# Patient Record
Sex: Male | Born: 1985 | Race: Black or African American | Hispanic: No | Marital: Single | Smoking: Never smoker
Health system: Southern US, Community
[De-identification: ages and names within clinical notes are randomized; demographics above are authoritative.]

## PROBLEM LIST (undated history)
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
## 2019-10-12 ENCOUNTER — Inpatient Hospital Stay (HOSPITAL_COMMUNITY)
Admission: EM | Admit: 2019-10-12 | Discharge: 2019-10-18 | DRG: 441 | Disposition: A | Discharge status: Home / Self Care | Payer: Medicare HMO | Attending: Physician Assistant | Admitting: Physician Assistant

## 2019-10-12 ENCOUNTER — Encounter (HOSPITAL_COMMUNITY): Payer: Self-pay | Admitting: *Deleted

## 2019-10-12 ENCOUNTER — Emergency Department (HOSPITAL_COMMUNITY): Payer: Medicare HMO

## 2019-10-12 DIAGNOSIS — N390 Urinary tract infection, site not specified: Secondary | ICD-10-CM | POA: Diagnosis not present

## 2019-10-12 DIAGNOSIS — S50811A Abrasion of right forearm, initial encounter: Secondary | ICD-10-CM | POA: Diagnosis present

## 2019-10-12 DIAGNOSIS — B957 Other staphylococcus as the cause of diseases classified elsewhere: Secondary | ICD-10-CM | POA: Diagnosis not present

## 2019-10-12 DIAGNOSIS — J1282 Pneumonia due to coronavirus disease 2019: Secondary | ICD-10-CM | POA: Diagnosis not present

## 2019-10-12 DIAGNOSIS — S0121XA Laceration without foreign body of nose, initial encounter: Secondary | ICD-10-CM | POA: Diagnosis present

## 2019-10-12 DIAGNOSIS — I2699 Other pulmonary embolism without acute cor pulmonale: Secondary | ICD-10-CM | POA: Diagnosis not present

## 2019-10-12 DIAGNOSIS — Z79899 Other long term (current) drug therapy: Secondary | ICD-10-CM | POA: Diagnosis not present

## 2019-10-12 DIAGNOSIS — S2241XA Multiple fractures of ribs, right side, initial encounter for closed fracture: Secondary | ICD-10-CM | POA: Diagnosis present

## 2019-10-12 DIAGNOSIS — U071 COVID-19: Secondary | ICD-10-CM | POA: Diagnosis present

## 2019-10-12 DIAGNOSIS — Z23 Encounter for immunization: Secondary | ICD-10-CM

## 2019-10-12 DIAGNOSIS — S2249XS Multiple fractures of ribs, unspecified side, sequela: Secondary | ICD-10-CM | POA: Diagnosis not present

## 2019-10-12 DIAGNOSIS — W51XXXA Accidental striking against or bumped into by another person, initial encounter: Secondary | ICD-10-CM | POA: Diagnosis not present

## 2019-10-12 DIAGNOSIS — S36116A Major laceration of liver, initial encounter: Secondary | ICD-10-CM | POA: Diagnosis present

## 2019-10-12 DIAGNOSIS — R6510 Systemic inflammatory response syndrome (SIRS) of non-infectious origin without acute organ dysfunction: Secondary | ICD-10-CM | POA: Diagnosis not present

## 2019-10-12 DIAGNOSIS — I5031 Acute diastolic (congestive) heart failure: Secondary | ICD-10-CM | POA: Diagnosis not present

## 2019-10-12 DIAGNOSIS — S31109A Unspecified open wound of abdominal wall, unspecified quadrant without penetration into peritoneal cavity, initial encounter: Secondary | ICD-10-CM | POA: Diagnosis present

## 2019-10-12 DIAGNOSIS — T1490XA Injury, unspecified, initial encounter: Secondary | ICD-10-CM | POA: Diagnosis present

## 2019-10-12 DIAGNOSIS — R0602 Shortness of breath: Secondary | ICD-10-CM

## 2019-10-12 DIAGNOSIS — S2249XA Multiple fractures of ribs, unspecified side, initial encounter for closed fracture: Secondary | ICD-10-CM | POA: Diagnosis present

## 2019-10-12 DIAGNOSIS — Z419 Encounter for procedure for purposes other than remedying health state, unspecified: Secondary | ICD-10-CM

## 2019-10-12 DIAGNOSIS — S36113A Laceration of liver, unspecified degree, initial encounter: Secondary | ICD-10-CM

## 2019-10-12 LAB — COMPREHENSIVE METABOLIC PANEL
ALT: 496 U/L — ABNORMAL HIGH (ref 0–44)
AST: 483 U/L — ABNORMAL HIGH (ref 15–41)
Albumin: 3.3 g/dL — ABNORMAL LOW (ref 3.5–5.0)
Alkaline Phosphatase: 54 U/L (ref 38–126)
Anion gap: 10 (ref 5–15)
BUN: 9 mg/dL (ref 6–20)
CO2: 22 mmol/L (ref 22–32)
Calcium: 8.3 mg/dL — ABNORMAL LOW (ref 8.9–10.3)
Chloride: 103 mmol/L (ref 98–111)
Creatinine, Ser: 0.97 mg/dL (ref 0.61–1.24)
GFR, Estimated: 47 mL/min — ABNORMAL LOW (ref >60)
Glucose, Bld: 146 mg/dL — ABNORMAL HIGH (ref 70–99)
Potassium: 3.9 mmol/L (ref 3.5–5.1)
Sodium: 135 mmol/L (ref 135–145)
Total Bilirubin: 0.6 mg/dL (ref 0.3–1.2)
Total Protein: 6.3 g/dL — ABNORMAL LOW (ref 6.5–8.1)

## 2019-10-12 LAB — I-STAT CHEM 8, ED
BUN: 9 mg/dL (ref 6–20)
Calcium, Ion: 1.02 mmol/L — ABNORMAL LOW (ref 1.15–1.40)
Chloride: 102 mmol/L (ref 98–111)
Creatinine, Ser: 0.9 mg/dL (ref 0.61–1.24)
Glucose, Bld: 143 mg/dL — ABNORMAL HIGH (ref 70–99)
HCT: 35 % — ABNORMAL LOW (ref 39.0–52.0)
Hemoglobin: 11.9 g/dL — ABNORMAL LOW (ref 13.0–17.0)
Potassium: 3.8 mmol/L (ref 3.5–5.1)
Sodium: 136 mmol/L (ref 135–145)
TCO2: 22 mmol/L (ref 22–32)

## 2019-10-12 LAB — LACTIC ACID, PLASMA: Lactic Acid, Venous: 2.8 mmol/L (ref 0.5–1.9)

## 2019-10-12 LAB — CBC
HCT: 36.7 % — ABNORMAL LOW (ref 39.0–52.0)
Hemoglobin: 12.3 g/dL — ABNORMAL LOW (ref 13.0–17.0)
MCH: 27.6 pg (ref 26.0–34.0)
MCHC: 33.5 g/dL (ref 30.0–36.0)
MCV: 82.3 fL (ref 80.0–100.0)
Platelets: 184 10*3/uL (ref 150–400)
RBC: 4.46 MIL/uL (ref 4.22–5.81)
RDW: 12.4 % (ref 11.5–15.5)
WBC: 8.4 10*3/uL (ref 4.0–10.5)
nRBC: 0 % (ref 0.0–0.2)

## 2019-10-12 LAB — RESPIRATORY PANEL BY RT PCR (FLU A&B, COVID)
Influenza A by PCR: NEGATIVE
Influenza B by PCR: NEGATIVE
SARS Coronavirus 2 by RT PCR: POSITIVE — AB

## 2019-10-12 LAB — SAMPLE TO BLOOD BANK

## 2019-10-12 LAB — PROTIME-INR
INR: 1.1 (ref 0.8–1.2)
Prothrombin Time: 14.1 seconds (ref 11.4–15.2)

## 2019-10-12 LAB — ETHANOL: Alcohol, Ethyl (B): <10 mg/dL (ref <10)

## 2019-10-12 MED ORDER — TETANUS-DIPHTH-ACELL PERTUSSIS 5-2.5-18.5 LF-MCG/0.5 IM SUSP
0.5000 mL | Freq: Once | INTRAMUSCULAR | Status: AC
  Administered 2019-10-12: 0.5 mL via INTRAMUSCULAR
  Filled 2019-10-12: qty 0.5

## 2019-10-12 MED ORDER — FENTANYL CITRATE (PF) 100 MCG/2ML IJ SOLN: 100.0000 ug | Freq: Once | INTRAMUSCULAR | Status: AC

## 2019-10-12 MED ORDER — IOHEXOL 300 MG/ML  SOLN
100.0000 mL | Freq: Once | INTRAMUSCULAR | Status: AC | PRN
  Administered 2019-10-12: 100 mL via INTRAVENOUS

## 2019-10-12 MED ORDER — FENTANYL CITRATE (PF) 100 MCG/2ML IJ SOLN
INTRAMUSCULAR | Status: AC
  Administered 2019-10-12: 100 ug via INTRAVENOUS
  Filled 2019-10-12: qty 2

## 2019-10-12 MED ORDER — MORPHINE SULFATE (PF) 4 MG/ML IV SOLN: 4.0000 mg | Freq: Once | INTRAVENOUS | Status: DC

## 2019-10-12 NOTE — ED Notes (Signed)
Pt says he speaks Somali, trying to reach interpreter for when pt returns from CT. No identification found on pt. [employer] standing by.

## 2019-10-12 NOTE — ED Notes (Signed)
[employer] asked pt for family contact information, pt gave two phone numbers, both numbers not working numbers.
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
## 2019-10-12 NOTE — ED Notes (Signed)
Patient transported to CT 

## 2019-10-12 NOTE — ED Notes (Signed)
Spoke with pt via tele interpreter. He keeps saying he has pain "in the bones around my heart" and that his "head hurts". He reports his name is David Gordon bd 06-11-1985. I offered to call his family, he declined. I placed his cell phone next to him. He is asking about his headphones and keys. I told him his keys were placed in his belonging bag, I could not find any headphones on his person. Registration and [employer] updated on pt identification.

## 2019-10-12 NOTE — ED Notes (Signed)
Dr Janee Morn at bedside. Resident and Trauma MD made aware of COVID Positive results
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
## 2019-10-13 LAB — URINALYSIS, ROUTINE W REFLEX MICROSCOPIC
Bacteria, UA: NONE SEEN
Bilirubin Urine: NEGATIVE
Glucose, UA: NEGATIVE mg/dL
Ketones, ur: NEGATIVE mg/dL
Leukocytes,Ua: NEGATIVE
Nitrite: NEGATIVE
Protein, ur: NEGATIVE mg/dL
Specific Gravity, Urine: >1.046 — ABNORMAL HIGH (ref 1.005–1.030)
pH: 5 (ref 5.0–8.0)

## 2019-10-13 LAB — CBC
HCT: 34.7 % — ABNORMAL LOW (ref 39.0–52.0)
HCT: 34.6 % — ABNORMAL LOW (ref 39.0–52.0)
HCT: 38.6 % — ABNORMAL LOW (ref 39.0–52.0)
Hemoglobin: 11.9 g/dL — ABNORMAL LOW (ref 13.0–17.0)
Hemoglobin: 11.6 g/dL — ABNORMAL LOW (ref 13.0–17.0)
Hemoglobin: 12.8 g/dL — ABNORMAL LOW (ref 13.0–17.0)
MCH: 29 pg (ref 26.0–34.0)
MCH: 28.5 pg (ref 26.0–34.0)
MCH: 28.3 pg (ref 26.0–34.0)
MCHC: 34.3 g/dL (ref 30.0–36.0)
MCHC: 33.5 g/dL (ref 30.0–36.0)
MCHC: 33.2 g/dL (ref 30.0–36.0)
MCV: 84.6 fL (ref 80.0–100.0)
MCV: 85 fL (ref 80.0–100.0)
MCV: 85.2 fL (ref 80.0–100.0)
Platelets: UNDETERMINED K/uL (ref 150–400)
Platelets: 143 10*3/uL — ABNORMAL LOW (ref 150–400)
Platelets: 165 K/uL (ref 150–400)
RBC: 4.1 MIL/uL — ABNORMAL LOW (ref 4.22–5.81)
RBC: 4.07 MIL/uL — ABNORMAL LOW (ref 4.22–5.81)
RBC: 4.53 MIL/uL (ref 4.22–5.81)
RDW: 12.9 % (ref 11.5–15.5)
RDW: 12.8 % (ref 11.5–15.5)
RDW: 13.1 % (ref 11.5–15.5)
WBC: 8.2 K/uL (ref 4.0–10.5)
WBC: 8.2 10*3/uL (ref 4.0–10.5)
WBC: 8.4 K/uL (ref 4.0–10.5)
nRBC: 0 % (ref 0.0–0.2)
nRBC: 0 % (ref 0.0–0.2)
nRBC: 0 % (ref 0.0–0.2)

## 2019-10-13 LAB — RAPID URINE DRUG SCREEN, HOSP PERFORMED
Amphetamines: NOT DETECTED
Barbiturates: NOT DETECTED
Benzodiazepines: NOT DETECTED
Cocaine: NOT DETECTED
Opiates: NOT DETECTED
Tetrahydrocannabinol: NOT DETECTED

## 2019-10-13 LAB — BASIC METABOLIC PANEL WITH GFR
Anion gap: 12 (ref 5–15)
BUN: 7 mg/dL (ref 6–20)
CO2: 16 mmol/L — ABNORMAL LOW (ref 22–32)
Calcium: 8.3 mg/dL — ABNORMAL LOW (ref 8.9–10.3)
Chloride: 107 mmol/L (ref 98–111)
Creatinine, Ser: 0.76 mg/dL (ref 0.61–1.24)
GFR, Estimated: >60 mL/min
Glucose, Bld: 130 mg/dL — ABNORMAL HIGH (ref 70–99)
Potassium: 4.4 mmol/L (ref 3.5–5.1)
Sodium: 135 mmol/L (ref 135–145)

## 2019-10-13 LAB — MRSA PCR SCREENING: MRSA by PCR: NEGATIVE

## 2019-10-13 MED ORDER — ACETAMINOPHEN 325 MG PO TABS
650.0000 mg | ORAL_TABLET | ORAL | Status: DC | PRN
  Administered 2019-10-13 – 2019-10-14 (×2): 650 mg via ORAL
  Filled 2019-10-13 (×2): qty 2

## 2019-10-13 MED ORDER — METHOCARBAMOL 1000 MG/10ML IJ SOLN
1000.0000 mg | Freq: Three times a day (TID) | INTRAVENOUS | Status: DC | PRN
  Filled 2019-10-13: qty 10

## 2019-10-13 MED ORDER — HYDROMORPHONE HCL 1 MG/ML IJ SOLN
1.0000 mg | INTRAMUSCULAR | Status: DC | PRN
  Administered 2019-10-14: 1 mg via INTRAVENOUS
  Filled 2019-10-13: qty 1

## 2019-10-13 MED ORDER — POTASSIUM CHLORIDE IN NACL 20-0.9 MEQ/L-% IV SOLN
INTRAVENOUS | Status: DC
  Administered 2019-10-14: 1000 mL via INTRAVENOUS
  Filled 2019-10-13 (×6): qty 1000

## 2019-10-13 MED ORDER — OXYCODONE HCL 5 MG PO TABS: 5.0000 mg | ORAL_TABLET | ORAL | Status: DC | PRN

## 2019-10-13 MED ORDER — OXYCODONE HCL 5 MG PO TABS: 10.0000 mg | ORAL_TABLET | ORAL | Status: DC | PRN

## 2019-10-13 NOTE — Progress Notes (Signed)
Pt was admitted to the floor and assessed with David Gordon, Char[employer] fundraiser. Significant language barrier persisted despite use of interpreter system. Pt did know where he lived and his name and DOB and why he was here. HR was 120s-130s upon arrival but has since stabilized in the 90s-100s.  UDS was ordered for concern of possible inebriation per conversation from the interpreter stating he seemed to be altered when speaking in his native language.Collected via in and out cath d/t pt retaining 564 in his bladder via bladder scan. Pt has yet to void independently.  Pt was able to move all extremities upon request and spontaneously. No contact information for the family at this time. Will attempt later on when better communication can be facilitated. Pt has no complaints of pain at this time Will continue to monitor and pass on to Day RN.

## 2019-10-13 NOTE — Progress Notes (Signed)
Received call from patient's sister, Yasmin, who stated that her brother has mental illness and has not been taking his medications properly. She emailed this RN a list of his medications. This information was forwarded to pharmacy tech, Nicolette Bang, who stated she would enter these medications into his chart. Also paged Trauma MD on call.
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
## 2019-10-14 ENCOUNTER — Inpatient Hospital Stay (HOSPITAL_COMMUNITY): Payer: Medicare HMO

## 2019-10-14 ENCOUNTER — Encounter (HOSPITAL_COMMUNITY): Payer: Self-pay | Admitting: General Surgery

## 2019-10-14 DIAGNOSIS — I2699 Other pulmonary embolism without acute cor pulmonale: Secondary | ICD-10-CM | POA: Diagnosis not present

## 2019-10-14 DIAGNOSIS — W51XXXA Accidental striking against or bumped into by another person, initial encounter: Secondary | ICD-10-CM | POA: Diagnosis present

## 2019-10-14 DIAGNOSIS — S2249XA Multiple fractures of ribs, unspecified side, initial encounter for closed fracture: Secondary | ICD-10-CM | POA: Diagnosis present

## 2019-10-14 DIAGNOSIS — R6510 Systemic inflammatory response syndrome (SIRS) of non-infectious origin without acute organ dysfunction: Secondary | ICD-10-CM | POA: Diagnosis not present

## 2019-10-14 DIAGNOSIS — S2241XA Multiple fractures of ribs, right side, initial encounter for closed fracture: Secondary | ICD-10-CM

## 2019-10-14 DIAGNOSIS — S2249XS Multiple fractures of ribs, unspecified side, sequela: Secondary | ICD-10-CM

## 2019-10-14 DIAGNOSIS — S36116A Major laceration of liver, initial encounter: Secondary | ICD-10-CM

## 2019-10-14 DIAGNOSIS — U071 COVID-19: Secondary | ICD-10-CM

## 2019-10-14 LAB — C-REACTIVE PROTEIN: CRP: 7 mg/dL — ABNORMAL HIGH (ref <1.0)

## 2019-10-14 LAB — CBC
HCT: 35 % — ABNORMAL LOW (ref 39.0–52.0)
HCT: 34.3 % — ABNORMAL LOW (ref 39.0–52.0)
Hemoglobin: 11.8 g/dL — ABNORMAL LOW (ref 13.0–17.0)
Hemoglobin: 11.8 g/dL — ABNORMAL LOW (ref 13.0–17.0)
MCH: 28.6 pg (ref 26.0–34.0)
MCH: 28.8 pg (ref 26.0–34.0)
MCHC: 33.7 g/dL (ref 30.0–36.0)
MCHC: 34.4 g/dL (ref 30.0–36.0)
MCV: 85 fL (ref 80.0–100.0)
MCV: 83.7 fL (ref 80.0–100.0)
Platelets: 175 10*3/uL (ref 150–400)
Platelets: 147 10*3/uL — ABNORMAL LOW (ref 150–400)
RBC: 4.12 MIL/uL — ABNORMAL LOW (ref 4.22–5.81)
RBC: 4.1 MIL/uL — ABNORMAL LOW (ref 4.22–5.81)
RDW: 13.2 % (ref 11.5–15.5)
RDW: 12.9 % (ref 11.5–15.5)
WBC: 10.6 10*3/uL — ABNORMAL HIGH (ref 4.0–10.5)
WBC: 10.5 10*3/uL (ref 4.0–10.5)
nRBC: 0 % (ref 0.0–0.2)
nRBC: 0 % (ref 0.0–0.2)

## 2019-10-14 LAB — HIV ANTIBODY (ROUTINE TESTING W REFLEX): HIV Screen 4th Generation wRfx: NONREACTIVE

## 2019-10-14 LAB — BASIC METABOLIC PANEL
Anion gap: 9 (ref 5–15)
BUN: 5 mg/dL — ABNORMAL LOW (ref 6–20)
CO2: 24 mmol/L (ref 22–32)
Calcium: 8.7 mg/dL — ABNORMAL LOW (ref 8.9–10.3)
Chloride: 105 mmol/L (ref 98–111)
Creatinine, Ser: 0.71 mg/dL (ref 0.61–1.24)
GFR, Estimated: >60 mL/min (ref >60)
Glucose, Bld: 101 mg/dL — ABNORMAL HIGH (ref 70–99)
Potassium: 4.3 mmol/L (ref 3.5–5.1)
Sodium: 138 mmol/L (ref 135–145)

## 2019-10-14 LAB — LACTATE DEHYDROGENASE: LDH: 345 U/L — ABNORMAL HIGH (ref 98–192)

## 2019-10-14 LAB — PROTIME-INR
INR: 1.3 — ABNORMAL HIGH (ref 0.8–1.2)
Prothrombin Time: 15.5 seconds — ABNORMAL HIGH (ref 11.4–15.2)

## 2019-10-14 LAB — HEPATIC FUNCTION PANEL
ALT: 563 U/L — ABNORMAL HIGH (ref 0–44)
AST: 255 U/L — ABNORMAL HIGH (ref 15–41)
Albumin: 3.1 g/dL — ABNORMAL LOW (ref 3.5–5.0)
Alkaline Phosphatase: 51 U/L (ref 38–126)
Bilirubin, Direct: 0.3 mg/dL — ABNORMAL HIGH (ref 0.0–0.2)
Indirect Bilirubin: 0.8 mg/dL (ref 0.3–0.9)
Total Bilirubin: 1.1 mg/dL (ref 0.3–1.2)
Total Protein: 6.1 g/dL — ABNORMAL LOW (ref 6.5–8.1)

## 2019-10-14 LAB — LACTIC ACID, PLASMA: Lactic Acid, Venous: 1.4 mmol/L (ref 0.5–1.9)

## 2019-10-14 LAB — APTT: aPTT: 27 seconds (ref 24–36)

## 2019-10-14 LAB — FERRITIN: Ferritin: 1159 ng/mL — ABNORMAL HIGH (ref 24–336)

## 2019-10-14 LAB — PROCALCITONIN: Procalcitonin: <0.1 ng/mL

## 2019-10-14 LAB — D-DIMER, QUANTITATIVE: D-Dimer, Quant: 10.72 ug/mL-FEU — ABNORMAL HIGH (ref 0.00–0.50)

## 2019-10-14 MED ORDER — OXYCODONE HCL 5 MG PO TABS: 5.0000 mg | ORAL_TABLET | ORAL | Status: DC | PRN

## 2019-10-14 MED ORDER — METHYLPREDNISOLONE SODIUM SUCC 40 MG IJ SOLR
40.0000 mg | Freq: Two times a day (BID) | INTRAMUSCULAR | Status: DC
  Administered 2019-10-14 – 2019-10-18 (×8): 40 mg via INTRAVENOUS
  Filled 2019-10-14 (×8): qty 1

## 2019-10-14 MED ORDER — IOHEXOL 350 MG/ML SOLN
100.0000 mL | Freq: Once | INTRAVENOUS | Status: AC | PRN
  Administered 2019-10-14: 100 mL via INTRAVENOUS

## 2019-10-14 MED ORDER — SODIUM CHLORIDE 0.9 % IV SOLN
100.0000 mg | Freq: Every day | INTRAVENOUS | Status: AC
  Administered 2019-10-15 – 2019-10-18 (×4): 100 mg via INTRAVENOUS
  Filled 2019-10-14 (×4): qty 20

## 2019-10-14 MED ORDER — ONDANSETRON HCL 4 MG/2ML IJ SOLN
4.0000 mg | Freq: Four times a day (QID) | INTRAMUSCULAR | Status: DC | PRN
  Administered 2019-10-14: 4 mg via INTRAVENOUS
  Filled 2019-10-14: qty 2

## 2019-10-14 MED ORDER — METOPROLOL TARTRATE 5 MG/5ML IV SOLN
5.0000 mg | Freq: Three times a day (TID) | INTRAVENOUS | Status: DC | PRN
  Administered 2019-10-16: 5 mg via INTRAVENOUS
  Filled 2019-10-14: qty 5

## 2019-10-14 MED ORDER — SODIUM CHLORIDE 0.9 % IV SOLN
200.0000 mg | Freq: Once | INTRAVENOUS | Status: AC
  Administered 2019-10-14: 200 mg via INTRAVENOUS
  Filled 2019-10-14: qty 40

## 2019-10-14 MED ORDER — ACETAMINOPHEN 500 MG PO TABS
1000.0000 mg | ORAL_TABLET | Freq: Four times a day (QID) | ORAL | Status: DC
  Administered 2019-10-14 – 2019-10-18 (×10): 1000 mg via ORAL
  Filled 2019-10-14 (×12): qty 2

## 2019-10-14 NOTE — Progress Notes (Signed)
   10/12/19 2320  Assess: MEWS Score  Temp (!) 100.9 F (38.3 C)  ECG Heart Rate (!) 124  Resp (!) 22  Level of Consciousness Alert  SpO2 98 %  O2 Device Room Air  Assess: MEWS Score  MEWS Temp 1  MEWS Systolic 0  MEWS Pulse 2  MEWS RR 1  MEWS LOC 0  MEWS Score 4  MEWS Score Color Red  Assess: if the MEWS score is Yellow or Red  Were vital signs taken at a resting state? Yes  Focused Assessment Change from prior assessment (see assessment flowsheet)  Early Detection of Sepsis Score *See Row Information* Low  MEWS guidelines implemented *See Row Information* Yes  Treat  MEWS Interventions Escalated (See documentation below)  Pain Scale 0-10  Pain Score 0  Take Vital Signs  Increase Vital Sign Frequency  Red: Q 1hr X 4 then Q 4hr X 4, if remains red, continue Q 4hrs  Escalate  MEWS: Escalate Red: discuss with charge nurse/RN and provider, consider discussing with RRT  Notify: Charge Nurse/RN  Name of Charge Nurse/RN Notified Nikki, RN  Date Charge Nurse/RN Notified 10/12/19  Time Charge Nurse/RN Notified 2320  Document  Patient Outcome Other (Comment) (Monitoring, Red in ED)
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
## 2019-10-14 NOTE — Sepsis Progress Note (Signed)
Spoke with provider regarding antibiotics. She is waiting on more information with radiological test results before deciding on the best course of action.

## 2019-10-14 NOTE — Progress Notes (Signed)
   10/14/19 0751  Assess: MEWS Score  Temp (!) 101.9 F (38.8 C)  BP 124/80  Pulse Rate (!) 115  ECG Heart Rate (!) 114  Resp 20  SpO2 95 %  Assess: MEWS Score  MEWS Temp 2  MEWS Systolic 0  MEWS Pulse 2  MEWS RR 0  MEWS LOC 0  MEWS Score 4  MEWS Score Color Red  Assess: if the MEWS score is Yellow or Red  Were vital signs taken at a resting state? Yes  Focused Assessment No change from prior assessment  Early Detection of Sepsis Score *See Row Information* Low  MEWS guidelines implemented *See Row Information* Yes  Treat  MEWS Interventions Escalated (See documentation below)  Pain Scale 0-10  Pain Score 3  Pain Type Acute pain  Pain Location Back  Pain Orientation Right  Pain Frequency Intermittent  Pain Onset On-going  Patients Stated Pain Goal 3  Pain Intervention(s) Repositioned  Take Vital Signs  Increase Vital Sign Frequency  Red: Q 1hr X 4 then Q 4hr X 4, if remains red, continue Q 4hrs  Escalate  MEWS: Escalate Red: discuss with charge nurse/RN and provider, consider discussing with RRT  Notify: Charge Nurse/RN  Name of Charge Nurse/RN Notified Erick Alley RN  Date Charge Nurse/RN Notified 10/14/19  Time Charge Nurse/RN Notified 0805  Notify: Provider  Provider Name/Title Trauma  Date Provider Notified 10/14/19  Time Provider Notified 0805  Notification Type Page  Notification Reason Other (Comment) (Red MEWS)  Notify: Rapid Response  Name of Rapid Response RN Notified N/A  Document  Progress note created (see row info) Yes

## 2019-10-14 NOTE — Sepsis Progress Note (Signed)
Notified provider of need to order antibiotics. Waiting to heat back. Patient is Covid positive.
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
## 2019-10-14 NOTE — Progress Notes (Signed)
Secure chat sent to bedside nurse at 1318 requesting information about LA and blood culture draws. Also, asking if bedside nurse knew if the MD has plans to order antibiotics and fluids. I have not heard back from the bedside nurse.
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
## 2019-10-14 NOTE — Progress Notes (Signed)
PA on-call was notified via AMION about patient's elevated RR (high 30s) and HR (low 110s-high 120s). Temp came down at 99.5.
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
## 2019-10-15 ENCOUNTER — Encounter (HOSPITAL_COMMUNITY): Admission: EM | Disposition: A | Discharge status: Home / Self Care | Payer: Self-pay | Source: Home / Self Care

## 2019-10-15 ENCOUNTER — Inpatient Hospital Stay (HOSPITAL_COMMUNITY): Payer: Medicare HMO | Admitting: Anesthesiology

## 2019-10-15 ENCOUNTER — Inpatient Hospital Stay (HOSPITAL_COMMUNITY): Payer: Medicare HMO

## 2019-10-15 ENCOUNTER — Encounter (HOSPITAL_COMMUNITY): Payer: Medicare HMO

## 2019-10-15 HISTORY — PX: VENA CAVA FILTER PLACEMENT: SHX1085

## 2019-10-15 LAB — CBC WITH DIFFERENTIAL/PLATELET
Abs Immature Granulocytes: 0.04 10*3/uL (ref 0.00–0.07)
Basophils Absolute: 0 10*3/uL (ref 0.0–0.1)
Basophils Relative: 0 %
Eosinophils Absolute: 0 10*3/uL (ref 0.0–0.5)
Eosinophils Relative: 0 %
HCT: 33.9 % — ABNORMAL LOW (ref 39.0–52.0)
Hemoglobin: 11.6 g/dL — ABNORMAL LOW (ref 13.0–17.0)
Immature Granulocytes: 0 %
Lymphocytes Relative: 6 %
Lymphs Abs: 0.6 10*3/uL — ABNORMAL LOW (ref 0.7–4.0)
MCH: 28.4 pg (ref 26.0–34.0)
MCHC: 34.2 g/dL (ref 30.0–36.0)
MCV: 83.1 fL (ref 80.0–100.0)
Monocytes Absolute: 0.4 10*3/uL (ref 0.1–1.0)
Monocytes Relative: 4 %
Neutro Abs: 9.1 10*3/uL — ABNORMAL HIGH (ref 1.7–7.7)
Neutrophils Relative %: 90 %
Platelets: 145 10*3/uL — ABNORMAL LOW (ref 150–400)
RBC: 4.08 MIL/uL — ABNORMAL LOW (ref 4.22–5.81)
RDW: 12.6 % (ref 11.5–15.5)
WBC: 10.2 10*3/uL (ref 4.0–10.5)
nRBC: 0 % (ref 0.0–0.2)

## 2019-10-15 LAB — COMPREHENSIVE METABOLIC PANEL
ALT: 444 U/L — ABNORMAL HIGH (ref 0–44)
AST: 146 U/L — ABNORMAL HIGH (ref 15–41)
Albumin: 3.2 g/dL — ABNORMAL LOW (ref 3.5–5.0)
Alkaline Phosphatase: 52 U/L (ref 38–126)
Anion gap: 10 (ref 5–15)
BUN: 8 mg/dL (ref 6–20)
CO2: 22 mmol/L (ref 22–32)
Calcium: 9.3 mg/dL (ref 8.9–10.3)
Chloride: 105 mmol/L (ref 98–111)
Creatinine, Ser: 0.71 mg/dL (ref 0.61–1.24)
GFR, Estimated: >60 mL/min (ref >60)
Glucose, Bld: 135 mg/dL — ABNORMAL HIGH (ref 70–99)
Potassium: 4.6 mmol/L (ref 3.5–5.1)
Sodium: 137 mmol/L (ref 135–145)
Total Bilirubin: 1.2 mg/dL (ref 0.3–1.2)
Total Protein: 6.7 g/dL (ref 6.5–8.1)

## 2019-10-15 LAB — SURGICAL PCR SCREEN
MRSA, PCR: NEGATIVE
Staphylococcus aureus: NEGATIVE

## 2019-10-15 LAB — PHOSPHORUS: Phosphorus: 2.9 mg/dL (ref 2.5–4.6)

## 2019-10-15 LAB — D-DIMER, QUANTITATIVE: D-Dimer, Quant: 8.56 ug/mL-FEU — ABNORMAL HIGH (ref 0.00–0.50)

## 2019-10-15 LAB — C-REACTIVE PROTEIN: CRP: 13 mg/dL — ABNORMAL HIGH (ref <1.0)

## 2019-10-15 LAB — FERRITIN: Ferritin: 891 ng/mL — ABNORMAL HIGH (ref 24–336)

## 2019-10-15 LAB — MAGNESIUM: Magnesium: 1.8 mg/dL (ref 1.7–2.4)

## 2019-10-15 SURGERY — INSERTION, FILTER, VENA CAVA
Anesthesia: General | Site: Groin | Laterality: Right

## 2019-10-15 MED ORDER — PROPOFOL 10 MG/ML IV BOLUS
INTRAVENOUS | Status: AC
  Filled 2019-10-15: qty 20

## 2019-10-15 MED ORDER — RISPERIDONE 0.5 MG PO TABS
4.0000 mg | ORAL_TABLET | Freq: Two times a day (BID) | ORAL | Status: DC
  Administered 2019-10-15 – 2019-10-18 (×6): 4 mg via ORAL
  Filled 2019-10-15 (×6): qty 8

## 2019-10-15 MED ORDER — METOPROLOL TARTRATE 5 MG/5ML IV SOLN
5.0000 mg | Freq: Once | INTRAVENOUS | Status: AC
  Administered 2019-10-15: 5 mg via INTRAVENOUS
  Filled 2019-10-15: qty 5

## 2019-10-15 MED ORDER — SODIUM CHLORIDE 0.9 % IV SOLN
INTRAVENOUS | Status: DC | PRN
  Administered 2019-10-15: 500 mL

## 2019-10-15 MED ORDER — ONDANSETRON HCL 4 MG/2ML IJ SOLN
INTRAMUSCULAR | Status: AC
  Filled 2019-10-15: qty 2

## 2019-10-15 MED ORDER — LACTATED RINGERS IV SOLN: INTRAVENOUS | Status: DC | PRN

## 2019-10-15 MED ORDER — ACETAMINOPHEN 325 MG PO TABS: 325.0000 mg | ORAL_TABLET | Freq: Once | ORAL | Status: DC | PRN

## 2019-10-15 MED ORDER — DEXAMETHASONE SODIUM PHOSPHATE 10 MG/ML IJ SOLN
INTRAMUSCULAR | Status: DC | PRN
  Administered 2019-10-15: 10 mg via INTRAVENOUS

## 2019-10-15 MED ORDER — LACTATED RINGERS IV SOLN: INTRAVENOUS | Status: DC

## 2019-10-15 MED ORDER — POTASSIUM CHLORIDE 2 MEQ/ML IV SOLN
INTRAVENOUS | Status: AC
  Filled 2019-10-15: qty 1000

## 2019-10-15 MED ORDER — DEXAMETHASONE SODIUM PHOSPHATE 10 MG/ML IJ SOLN
INTRAMUSCULAR | Status: AC
  Filled 2019-10-15: qty 1

## 2019-10-15 MED ORDER — AMISULPRIDE (ANTIEMETIC) 5 MG/2ML IV SOLN: 10.0000 mg | Freq: Once | INTRAVENOUS | Status: DC | PRN

## 2019-10-15 MED ORDER — ONDANSETRON HCL 4 MG/2ML IJ SOLN
INTRAMUSCULAR | Status: DC | PRN
  Administered 2019-10-15: 4 mg via INTRAVENOUS

## 2019-10-15 MED ORDER — LIDOCAINE 2% (20 MG/ML) 5 ML SYRINGE
INTRAMUSCULAR | Status: DC | PRN
  Administered 2019-10-15: 40 mg via INTRAVENOUS

## 2019-10-15 MED ORDER — SODIUM CHLORIDE (PF) 0.9 % IJ SOLN
INTRAVENOUS | Status: DC | PRN
  Administered 2019-10-15: 5 mL via INTRAMUSCULAR

## 2019-10-15 MED ORDER — QUETIAPINE FUMARATE ER 300 MG PO TB24
300.0000 mg | ORAL_TABLET | Freq: Every day | ORAL | Status: DC
  Administered 2019-10-15 – 2019-10-17 (×3): 300 mg via ORAL
  Filled 2019-10-15 (×4): qty 1

## 2019-10-15 MED ORDER — FENTANYL CITRATE (PF) 250 MCG/5ML IJ SOLN
INTRAMUSCULAR | Status: AC
Start: 2019-10-15 — End: ?
  Filled 2019-10-15: qty 5

## 2019-10-15 MED ORDER — SUCCINYLCHOLINE CHLORIDE 200 MG/10ML IV SOSY
PREFILLED_SYRINGE | INTRAVENOUS | Status: AC
  Filled 2019-10-15: qty 10

## 2019-10-15 MED ORDER — HYDROMORPHONE HCL 1 MG/ML IJ SOLN
1.0000 mg | Freq: Four times a day (QID) | INTRAMUSCULAR | Status: DC | PRN
  Administered 2019-10-16: 1 mg via INTRAVENOUS
  Filled 2019-10-15: qty 1

## 2019-10-15 MED ORDER — BENZTROPINE MESYLATE 1 MG PO TABS
3.0000 mg | ORAL_TABLET | Freq: Two times a day (BID) | ORAL | Status: DC
  Administered 2019-10-15 – 2019-10-18 (×6): 3 mg via ORAL
  Filled 2019-10-15 (×6): qty 3

## 2019-10-15 MED ORDER — FENTANYL CITRATE (PF) 250 MCG/5ML IJ SOLN
INTRAMUSCULAR | Status: DC | PRN
Start: 2019-10-15 — End: 2019-10-15
  Administered 2019-10-15: 100 ug via INTRAVENOUS
  Administered 2019-10-15 (×3): 50 ug via INTRAVENOUS

## 2019-10-15 MED ORDER — FENTANYL CITRATE (PF) 100 MCG/2ML IJ SOLN: 25.0000 ug | INTRAMUSCULAR | Status: DC | PRN

## 2019-10-15 MED ORDER — SUCCINYLCHOLINE CHLORIDE 200 MG/10ML IV SOSY
PREFILLED_SYRINGE | INTRAVENOUS | Status: DC | PRN
  Administered 2019-10-15: 140 mg via INTRAVENOUS

## 2019-10-15 MED ORDER — POTASSIUM CHLORIDE IN NACL 20-0.9 MEQ/L-% IV SOLN
INTRAVENOUS | Status: DC
  Filled 2019-10-15: qty 1000

## 2019-10-15 MED ORDER — MIDAZOLAM HCL 2 MG/2ML IJ SOLN
INTRAMUSCULAR | Status: DC | PRN
  Administered 2019-10-15: 2 mg via INTRAVENOUS

## 2019-10-15 MED ORDER — ACETAMINOPHEN 160 MG/5ML PO SOLN: 325.0000 mg | Freq: Once | ORAL | Status: DC | PRN

## 2019-10-15 MED ORDER — BENZTROPINE MESYLATE 1 MG PO TABS: 2.0000 mg | ORAL_TABLET | Freq: Two times a day (BID) | ORAL | Status: DC

## 2019-10-15 MED ORDER — PROPOFOL 10 MG/ML IV BOLUS
INTRAVENOUS | Status: DC | PRN
  Administered 2019-10-15: 160 mg via INTRAVENOUS

## 2019-10-15 MED ORDER — MIDAZOLAM HCL 2 MG/2ML IJ SOLN
INTRAMUSCULAR | Status: AC
  Filled 2019-10-15: qty 2

## 2019-10-15 MED ORDER — SODIUM CHLORIDE 0.9 % IV SOLN
INTRAVENOUS | Status: AC
  Filled 2019-10-15: qty 1.2

## 2019-10-15 MED ORDER — ACETAMINOPHEN 10 MG/ML IV SOLN: 1000.0000 mg | Freq: Once | INTRAVENOUS | Status: DC | PRN

## 2019-10-15 MED ORDER — BENZTROPINE MESYLATE 1 MG PO TABS: 1.0000 mg | ORAL_TABLET | Freq: Two times a day (BID) | ORAL | Status: DC

## 2019-10-15 MED ORDER — MEPERIDINE HCL 25 MG/ML IJ SOLN: 6.2500 mg | INTRAMUSCULAR | Status: DC | PRN

## 2019-10-15 SURGICAL SUPPLY — 38 items
BAG DECANTER FOR FLEXI CONT (MISCELLANEOUS) ×3 IMPLANT
BLADE SURG 11 STRL SS (BLADE) ×3 IMPLANT
COVER BACK TABLE 60X90IN (DRAPES) ×3 IMPLANT
COVER PROBE W GEL 5X96 (DRAPES) ×3 IMPLANT
COVER WAND RF STERILE (DRAPES) ×1 IMPLANT
DECANTER SPIKE VIAL GLASS SM (MISCELLANEOUS) ×1 IMPLANT
DERMABOND ADVANCED (GAUZE/BANDAGES/DRESSINGS) ×2
DERMABOND ADVANCED .7 DNX12 (GAUZE/BANDAGES/DRESSINGS) ×1 IMPLANT
DRAPE C-ARM 42X72 X-RAY (DRAPES) ×3 IMPLANT
DRAPE LAPAROTOMY T 102X78X121 (DRAPES) ×3 IMPLANT
FILTER VC CELECT-FEMORAL (Filter) ×2 IMPLANT
GAUZE 4X4 16PLY RFD (DISPOSABLE) ×5 IMPLANT
GAUZE SPONGE 4X4 12PLY STRL (GAUZE/BANDAGES/DRESSINGS) ×1 IMPLANT
GLOVE BIO SURGEON STRL SZ7.5 (GLOVE) ×7 IMPLANT
GLOVE BIOGEL PI IND STRL 8 (GLOVE) ×1 IMPLANT
GLOVE BIOGEL PI INDICATOR 8 (GLOVE)
GOWN STRL REUS W/ TWL LRG LVL3 (GOWN DISPOSABLE) ×2 IMPLANT
GOWN STRL REUS W/TWL LRG LVL3 (GOWN DISPOSABLE) ×4
KIT BASIN OR (CUSTOM PROCEDURE TRAY) ×3 IMPLANT
KIT TURNOVER KIT B (KITS) ×3 IMPLANT
NDL HYPO 25GX1X1/2 BEV (NEEDLE) ×1 IMPLANT
NDL PERC 18GX7CM (NEEDLE) ×1 IMPLANT
NEEDLE HYPO 25GX1X1/2 BEV (NEEDLE) IMPLANT
NEEDLE PERC 18GX7CM (NEEDLE) ×3 IMPLANT
NS IRRIG 1000ML POUR BTL (IV SOLUTION) ×3 IMPLANT
PACK SURGICAL SETUP 50X90 (CUSTOM PROCEDURE TRAY) ×1 IMPLANT
PAD ARMBOARD 7.5X6 YLW CONV (MISCELLANEOUS) ×6 IMPLANT
SHEATH FAST CATH 10F 12CM (SHEATH) ×2 IMPLANT
SUT VICRYL 4-0 PS2 18IN ABS (SUTURE) ×1 IMPLANT
SYR 20ML LL LF (SYRINGE) ×2 IMPLANT
SYR 30ML LL (SYRINGE) ×5 IMPLANT
SYR 5ML LL (SYRINGE) ×3 IMPLANT
SYR CONTROL 10ML LL (SYRINGE) ×1 IMPLANT
TOWEL GREEN STERILE (TOWEL DISPOSABLE) ×3 IMPLANT
WATER STERILE IRR 1000ML POUR (IV SOLUTION) ×3 IMPLANT
WIRE BENTSON .035X145CM (WIRE) ×2 IMPLANT
WIRE J 3MM .035X145CM (WIRE) ×1 IMPLANT
WIRE ROSEN-J .035X260CM (WIRE) ×4 IMPLANT

## 2019-10-15 NOTE — Anesthesia Postprocedure Evaluation (Signed)
Anesthesia Post Note  Patient: David Gordon  Procedure(s) Performed: INSERTION VENA-CAVA FILTER (Right Groin)     Patient location during evaluation: PACU Anesthesia Type: General Level of consciousness: awake and alert Pain management: pain level controlled Vital Signs Assessment: post-procedure vital signs reviewed and stable Respiratory status: spontaneous breathing, nonlabored ventilation, respiratory function stable and patient connected to nasal cannula oxygen Cardiovascular status: blood pressure returned to baseline and stable Postop Assessment: no apparent nausea or vomiting Anesthetic complications: no   No complications documented.  Last Vitals:  Vitals:   10/15/19 1559 10/15/19 1631  BP: (!) 149/86 140/86  Pulse: (!) 113 (!) 111  Resp: 15 (!) 22  Temp:  36.9 C  SpO2: 95% 93%    Last Pain:  Vitals:   10/15/19 1631  TempSrc: Oral  PainSc: 0-No pain                 Shelton Silvas
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
## 2019-10-15 NOTE — Progress Notes (Signed)
Pt has returned to 5W11 from IVC filter placement in OR. He is A&Ox4, with a R femoral incision site closed with skin glue clean, dry, intact. Pt denies any pain or discomfort.

## 2019-10-15 NOTE — Op Note (Signed)
    OPERATIVE REPORT  DATE OF SURGERY: 10/15/2019  PATIENT: David Gordon, 34 y.o. male MRN: [id number]  DOB: 1985-01-19  PRE-OPERATIVE DIAGNOSIS: Pulmonary embolus  POST-OPERATIVE DIAGNOSIS:  Same  PROCEDURE: Adriana Simas inferior vena cava filter  SURGEON:  Gretta Began, M.D.  PHYSICIAN ASSISTANT: Nurse  The assistant was needed for exposure and to expedite the case  ANESTHESIA: General  EBL: per anesthesia record  Total I/O In: 175.9 [I.V.:75.9; IV Piggyback:100] Out: 605 [Urine:600; Blood:5]  BLOOD ADMINISTERED: none  DRAINS: none  SPECIMEN: none  COUNTS CORRECT:  YES  PATIENT DISPOSITION:  PACU - hemodynamically stable  PROCEDURE DETAILS: Patient was taken up and placed supine position where the area of the both groins were prepped and draped in usual sterile fashion.  Using SonoSite for localization the right common femoral vein was accessed with a singlewall puncture with an 18-gauge needle.  A Bentson wire was passed centrally and this is confirmed with fluoroscopy.  [facility] sheath was positioned in the inferior vena cava at approximately the level of the L3 level.  Hand-injection through the sheath revealed that this was indicated and that the patient had a normal size cava.  The renal veins did not project well with hand-injection.  Preoperative imaging revealed that these arise at the lower edge of the L1 vertebra.  The dilator was removed and the filter was placed through the sheath.  Sheath was placed at the upper portion of the filter on the junction between the second and third lumbar vertebra.  The filter was released from the delivery system and was in good position.  The delivery sheath was removed.  Pressure was held for hemostasis and the patient was transferred to the recovery in stable condition   Larina Earthly, M.D., [facility] 10/15/2019 3:34 PM
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
## 2019-10-15 NOTE — Progress Notes (Signed)
Patient's sister Yasmin was called and informed about his condition back from OR.

## 2019-10-15 NOTE — Progress Notes (Signed)
   10/15/19 1656  Assess: MEWS Score  Temp 98.5 F (36.9 C)  BP 130/79  Pulse Rate (!) 113  ECG Heart Rate (!) 111  Resp 20  Level of Consciousness Alert  SpO2 93 %  O2 Device Room Air  Assess: MEWS Score  MEWS Temp 0  MEWS Systolic 0  MEWS Pulse 2  MEWS RR 0  MEWS LOC 0  MEWS Score 2  MEWS Score Color Yellow  Assess: if the MEWS score is Yellow or Red  Were vital signs taken at a resting state? Yes  Focused Assessment No change from prior assessment  Early Detection of Sepsis Score *See Row Information* Low  MEWS guidelines implemented *See Row Information* Yes  Treat  MEWS Interventions Escalated (See documentation below)  Pain Scale 0-10  Pain Score 0  Take Vital Signs  Increase Vital Sign Frequency  Yellow: Q 2hr X 2 then Q 4hr X 2, if remains yellow, continue Q 4hrs  Escalate  MEWS: Escalate Yellow: discuss with charge nurse/RN and consider discussing with provider and RRT  Notify: Charge Nurse/RN  Name of Charge Nurse/RN Notified Erick Alley, RN  Date Charge Nurse/RN Notified 10/15/19  Time Charge Nurse/RN Notified 1702  Notify: Provider  Provider Name/Title MD trauma  Date Provider Notified 10/15/19  Time Provider Notified 1703  Notification Type Page  Notification Reason Other (Comment) ( HR elevated)  Notify: Rapid Response  Name of Rapid Response RN Notified N/A  Document  Progress note created (see row info) Yes

## 2019-10-15 NOTE — Progress Notes (Signed)
Patient's sister, Yasmin, has given consent via phone call for IVC filter placement today. She would prefer to be contacted for future consents due to patient's mental disability and limited English.

## 2019-10-15 NOTE — Anesthesia Procedure Notes (Signed)
Procedure Name: Intubation Date/Time: 10/15/2019 2:55 PM Performed by: Rosiland Oz, CRNA Pre-anesthesia Checklist: Emergency Drugs available, Suction available, Patient identified, Patient being monitored and Timeout performed Patient Re-evaluated:Patient Re-evaluated prior to induction Oxygen Delivery Method: Circle system utilized Preoxygenation: Pre-oxygenation with 100% oxygen Induction Type: IV induction and Rapid sequence Laryngoscope Size: Miller and 2 Grade View: Grade I Tube type: Oral Tube size: 7.5 mm Number of attempts: 1 Airway Equipment and Method: Stylet Placement Confirmation: ETT inserted through vocal cords under direct vision,  positive ETCO2 and breath sounds checked- equal and bilateral Secured at: 22 cm Tube secured with: Tape Dental Injury: Teeth and Oropharynx as per pre-operative assessment
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
## 2019-10-15 NOTE — Progress Notes (Signed)
PT RECOVERED IN OR 11. POST OPERATIVELY TRANSPORTED TO 9B71
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
## 2019-10-15 NOTE — Anesthesia Preprocedure Evaluation (Addendum)
Anesthesia Evaluation  Patient identified by MRN, date of birth, ID band Patient awake    Reviewed: Allergy & Precautions, NPO status , Patient's Chart, lab work & pertinent test results  Airway Mallampati: II  TM Distance: >3 FB Neck ROM: Full    Dental  (+) Poor Dentition, Dental Advisory Given   Pulmonary neg pulmonary ROS,     + decreased breath sounds      Cardiovascular negative cardio ROS   Rhythm:Regular Rate:Tachycardia     Neuro/Psych negative neurological ROS  negative psych ROS   GI/Hepatic negative GI ROS, Neg liver ROS,   Endo/Other  negative endocrine ROS  Renal/GU negative Renal ROS     Musculoskeletal negative musculoskeletal ROS (+)   Abdominal Normal abdominal exam  (+)   Peds  Hematology negative hematology ROS (+)   Anesthesia Other Findings   Reproductive/Obstetrics                            Anesthesia Physical Anesthesia Plan  ASA: III  Anesthesia Plan: General   Post-op Pain Management:    Induction: Intravenous  PONV Risk Score and Plan: 3 and Ondansetron, Dexamethasone and Midazolam  Airway Management Planned: Oral ETT  Additional Equipment: None  Intra-op Plan:   Post-operative Plan: Extubation in OR  Informed Consent: I have reviewed the patients History and Physical, chart, labs and discussed the procedure including the risks, benefits and alternatives for the proposed anesthesia with the patient or authorized representative who has indicated his/her understanding and acceptance.     Dental advisory given  Plan Discussed with: CRNA  Anesthesia Plan Comments: (Covid-19 Nucleic Acid Test Results Lab Results      Component                Value               Date                      [id number]              POSITIVE (A)        10/12/2019           )       Anesthesia Quick Evaluation

## 2019-10-15 NOTE — Transfer of Care (Signed)
Immediate Anesthesia Transfer of Care Note  Patient: David Gordon  Procedure(s) Performed: INSERTION VENA-CAVA FILTER (Right Groin)  Patient Location: OR  Anesthesia Type:General  Level of Consciousness: drowsy and patient cooperative  Airway & Oxygen Therapy: Patient Spontanous Breathing  Post-op Assessment: Report given to RN and Post -op Vital signs reviewed and stable  Post vital signs: Reviewed and stable  Last Vitals:  Vitals Value Taken Time  BP    Temp    Pulse    Resp    SpO2      Last Pain:  Vitals:   10/15/19 1132  TempSrc: Oral  PainSc: 0-No pain      Patients Stated Pain Goal: 3 (10/14/19 0911)  Complications: No complications documented.
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
## 2019-10-16 ENCOUNTER — Inpatient Hospital Stay (HOSPITAL_COMMUNITY): Payer: Medicare HMO

## 2019-10-16 ENCOUNTER — Other Ambulatory Visit: Payer: Self-pay

## 2019-10-16 ENCOUNTER — Encounter (HOSPITAL_COMMUNITY): Payer: Self-pay | Admitting: Vascular Surgery

## 2019-10-16 DIAGNOSIS — I5031 Acute diastolic (congestive) heart failure: Secondary | ICD-10-CM

## 2019-10-16 DIAGNOSIS — I2699 Other pulmonary embolism without acute cor pulmonale: Secondary | ICD-10-CM | POA: Diagnosis not present

## 2019-10-16 LAB — PROTIME-INR
INR: 1.2 (ref 0.8–1.2)
Prothrombin Time: 14.5 seconds (ref 11.4–15.2)

## 2019-10-16 LAB — COMPREHENSIVE METABOLIC PANEL
ALT: 325 U/L — ABNORMAL HIGH (ref 0–44)
AST: 64 U/L — ABNORMAL HIGH (ref 15–41)
Albumin: 3.1 g/dL — ABNORMAL LOW (ref 3.5–5.0)
Alkaline Phosphatase: 54 U/L (ref 38–126)
Anion gap: 9 (ref 5–15)
BUN: 14 mg/dL (ref 6–20)
CO2: 24 mmol/L (ref 22–32)
Calcium: 9.4 mg/dL (ref 8.9–10.3)
Chloride: 103 mmol/L (ref 98–111)
Creatinine, Ser: 0.8 mg/dL (ref 0.61–1.24)
GFR, Estimated: >60 mL/min (ref >60)
Glucose, Bld: 186 mg/dL — ABNORMAL HIGH (ref 70–99)
Potassium: 4.4 mmol/L (ref 3.5–5.1)
Sodium: 136 mmol/L (ref 135–145)
Total Bilirubin: 0.5 mg/dL (ref 0.3–1.2)
Total Protein: 6.7 g/dL (ref 6.5–8.1)

## 2019-10-16 LAB — BRAIN NATRIURETIC PEPTIDE: B Natriuretic Peptide: 88.6 pg/mL (ref 0.0–100.0)

## 2019-10-16 LAB — CBC WITH DIFFERENTIAL/PLATELET
Abs Immature Granulocytes: 0.1 10*3/uL — ABNORMAL HIGH (ref 0.00–0.07)
Basophils Absolute: 0 10*3/uL (ref 0.0–0.1)
Basophils Relative: 0 %
Eosinophils Absolute: 0 10*3/uL (ref 0.0–0.5)
Eosinophils Relative: 0 %
HCT: 33.5 % — ABNORMAL LOW (ref 39.0–52.0)
Hemoglobin: 11.6 g/dL — ABNORMAL LOW (ref 13.0–17.0)
Immature Granulocytes: 1 %
Lymphocytes Relative: 6 %
Lymphs Abs: 0.6 10*3/uL — ABNORMAL LOW (ref 0.7–4.0)
MCH: 28.4 pg (ref 26.0–34.0)
MCHC: 34.6 g/dL (ref 30.0–36.0)
MCV: 82.1 fL (ref 80.0–100.0)
Monocytes Absolute: 0.5 10*3/uL (ref 0.1–1.0)
Monocytes Relative: 4 %
Neutro Abs: 9.1 10*3/uL — ABNORMAL HIGH (ref 1.7–7.7)
Neutrophils Relative %: 89 %
Platelets: 181 10*3/uL (ref 150–400)
RBC: 4.08 MIL/uL — ABNORMAL LOW (ref 4.22–5.81)
RDW: 12.4 % (ref 11.5–15.5)
WBC: 10.3 10*3/uL (ref 4.0–10.5)
nRBC: 0 % (ref 0.0–0.2)

## 2019-10-16 LAB — ECHOCARDIOGRAM LIMITED
Area-P 1/2: 4.86 cm2
Calc EF: 62.9 %
Height: 66 in
S' Lateral: 3.5 cm
Single Plane A2C EF: 64.3 %
Single Plane A4C EF: 60.9 %
Weight: 3287.5 oz

## 2019-10-16 LAB — URINE CULTURE: Culture: >=100000 — AB

## 2019-10-16 LAB — MAGNESIUM: Magnesium: 2.1 mg/dL (ref 1.7–2.4)

## 2019-10-16 LAB — FERRITIN: Ferritin: 738 ng/mL — ABNORMAL HIGH (ref 24–336)

## 2019-10-16 LAB — D-DIMER, QUANTITATIVE: D-Dimer, Quant: 8.13 ug/mL-FEU — ABNORMAL HIGH (ref 0.00–0.50)

## 2019-10-16 LAB — TSH: TSH: 3.695 u[IU]/mL (ref 0.350–4.500)

## 2019-10-16 LAB — PROCALCITONIN: Procalcitonin: <0.1 ng/mL

## 2019-10-16 LAB — PHOSPHORUS: Phosphorus: 4 mg/dL (ref 2.5–4.6)

## 2019-10-16 LAB — C-REACTIVE PROTEIN: CRP: 9.1 mg/dL — ABNORMAL HIGH (ref <1.0)

## 2019-10-16 MED ORDER — LACTATED RINGERS IV SOLN: INTRAVENOUS | Status: DC

## 2019-10-16 MED ORDER — CEPHALEXIN 500 MG PO CAPS
500.0000 mg | ORAL_CAPSULE | Freq: Three times a day (TID) | ORAL | Status: DC
  Administered 2019-10-17 – 2019-10-18 (×4): 500 mg via ORAL
  Filled 2019-10-16 (×7): qty 1

## 2019-10-16 MED ORDER — CEFAZOLIN SODIUM-DEXTROSE 1-4 GM/50ML-% IV SOLN
1.0000 g | Freq: Three times a day (TID) | INTRAVENOUS | Status: AC
  Administered 2019-10-16 – 2019-10-17 (×3): 1 g via INTRAVENOUS
  Filled 2019-10-16 (×4): qty 50
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
## 2019-10-16 NOTE — Progress Notes (Signed)
Pt continues to be monitored with a yellow MEWs. Charge nurse Tobi Bastos notified. Patient in no distress. Will continue to monitor.

## 2019-10-16 NOTE — Progress Notes (Signed)
Urine culture came back with >100k colonies of staph epi. It's pan sens. Dr.Singh would like to treat with 1d of IV cefazolin followed by 4 days of keflex.   Ulyses Southward, PharmD, BCIDP, AAHIVP, CPP Infectious Disease Pharmacist 10/16/2019 1:18 PM

## 2019-10-16 NOTE — Progress Notes (Signed)
Bilateral lower extremity venous duplex has been completed. Preliminary results can be found in CV Proc through chart review.   10/16/19 12:49 PM Olen Cordial RVT
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
## 2019-10-16 NOTE — Progress Notes (Signed)
  Echocardiogram 2D Echocardiogram has been performed.  David Gordon 10/16/2019, 4:09 PM

## 2019-10-16 NOTE — Progress Notes (Signed)
Patient's mews score red. Notified MD and charge nurse. NAD noted at this time.  Order received. Will continue to monitor.   10/16/19 1232  Assess: MEWS Score  Temp 98.4 F (36.9 C)  BP 126/64  Pulse Rate (!) 134  ECG Heart Rate (!) 134  Resp (!) 27  Level of Consciousness Alert  SpO2 96 %  O2 Device Room Air  Assess: MEWS Score  MEWS Temp 0  MEWS Systolic 0  MEWS Pulse 3  MEWS RR 2  MEWS LOC 0  MEWS Score 5  MEWS Score Color Red  Assess: if the MEWS score is Yellow or Red  Were vital signs taken at a resting state? Yes  Focused Assessment No change from prior assessment  Early Detection of Sepsis Score *See Row Information* Low  MEWS guidelines implemented *See Row Information* Yes  Treat  MEWS Interventions Escalated (See documentation below)  Pain Scale 0-10  Pain Score 0  Take Vital Signs  Increase Vital Sign Frequency  Red: Q 1hr X 4 then Q 4hr X 4, if remains red, continue Q 4hrs  Escalate  MEWS: Escalate Red: discuss with charge nurse/RN and provider, consider discussing with RRT  Notify: Charge Nurse/RN  Name of Charge Nurse/RN Notified Elisa RN  Date Charge Nurse/RN Notified 10/16/19  Time Charge Nurse/RN Notified 1238  Notify: Provider  Provider Name/Title Thedore Mins  Date Provider Notified 10/16/19  Time Provider Notified 1238  Notification Type  (secure chat)  Notification Reason Other (Comment) (red mews score)  Response See new orders  Date of Provider Response 10/16/19  Time of Provider Response 1239

## 2019-10-17 LAB — COMPREHENSIVE METABOLIC PANEL
ALT: 238 U/L — ABNORMAL HIGH (ref 0–44)
AST: 32 U/L (ref 15–41)
Albumin: 3 g/dL — ABNORMAL LOW (ref 3.5–5.0)
Alkaline Phosphatase: 54 U/L (ref 38–126)
Anion gap: 8 (ref 5–15)
BUN: 13 mg/dL (ref 6–20)
CO2: 24 mmol/L (ref 22–32)
Calcium: 9.1 mg/dL (ref 8.9–10.3)
Chloride: 106 mmol/L (ref 98–111)
Creatinine, Ser: 0.77 mg/dL (ref 0.61–1.24)
GFR, Estimated: >60 mL/min (ref >60)
Glucose, Bld: 190 mg/dL — ABNORMAL HIGH (ref 70–99)
Potassium: 4.4 mmol/L (ref 3.5–5.1)
Sodium: 138 mmol/L (ref 135–145)
Total Bilirubin: 0.5 mg/dL (ref 0.3–1.2)
Total Protein: 6.5 g/dL (ref 6.5–8.1)

## 2019-10-17 LAB — CBC WITH DIFFERENTIAL/PLATELET
Abs Immature Granulocytes: 0.15 10*3/uL — ABNORMAL HIGH (ref 0.00–0.07)
Basophils Absolute: 0 10*3/uL (ref 0.0–0.1)
Basophils Relative: 0 %
Eosinophils Absolute: 0 10*3/uL (ref 0.0–0.5)
Eosinophils Relative: 0 %
HCT: 33.6 % — ABNORMAL LOW (ref 39.0–52.0)
Hemoglobin: 11.3 g/dL — ABNORMAL LOW (ref 13.0–17.0)
Immature Granulocytes: 1 %
Lymphocytes Relative: 6 %
Lymphs Abs: 0.8 10*3/uL (ref 0.7–4.0)
MCH: 27.7 pg (ref 26.0–34.0)
MCHC: 33.6 g/dL (ref 30.0–36.0)
MCV: 82.4 fL (ref 80.0–100.0)
Monocytes Absolute: 0.8 10*3/uL (ref 0.1–1.0)
Monocytes Relative: 6 %
Neutro Abs: 11.3 10*3/uL — ABNORMAL HIGH (ref 1.7–7.7)
Neutrophils Relative %: 87 %
Platelets: 205 10*3/uL (ref 150–400)
RBC: 4.08 MIL/uL — ABNORMAL LOW (ref 4.22–5.81)
RDW: 12.6 % (ref 11.5–15.5)
WBC: 13 10*3/uL — ABNORMAL HIGH (ref 4.0–10.5)
nRBC: 0.3 % — ABNORMAL HIGH (ref 0.0–0.2)

## 2019-10-17 LAB — C-REACTIVE PROTEIN: CRP: 4 mg/dL — ABNORMAL HIGH (ref <1.0)

## 2019-10-17 LAB — PROCALCITONIN: Procalcitonin: <0.1 ng/mL

## 2019-10-17 LAB — BRAIN NATRIURETIC PEPTIDE: B Natriuretic Peptide: 68 pg/mL (ref 0.0–100.0)

## 2019-10-17 MED ORDER — LACTATED RINGERS IV SOLN: INTRAVENOUS | Status: AC

## 2019-10-17 NOTE — Progress Notes (Signed)
2 Days Post-Op  Subjective: CC: No reported symptoms. Still having tachycardia but some normal HR's throughout the night. No CP, SOB, abdominal pain, n/v. Tolerating diet. Hasn't mobilized but oob in the chair this am.   Objective: Vital signs in last 24 hours: Temp:  [97.9 F (36.6 C)-98.6 F (37 C)] 98.2 F (36.8 C) (10/14 0919) Pulse Rate:  [75-134] 131 (10/14 0919) Resp:  [19-28] 19 (10/14 0919) BP: (112-128)/(63-85) 128/82 (10/14 0919) SpO2:  [93 %-98 %] 98 % (10/14 0919) Last BM Date:  (pta)  Intake/Output from previous day: 10/13 0701 - 10/14 0700 In: 669.1 [P.O.:480; IV Piggyback:189.1] Out: 2400 [Urine:2400] Intake/Output this shift: Total I/O In: 300 [P.O.:300] Out: 300 [Urine:300]  PE: Gen: Alert, NAD, pleasant HEENT: EOM's intact, pupils equal and round Card:Tachycardic. 130's on monitor.  Pulm: CTAB, no W/R/R, effort normal, on RA Abd: Soft, NT/ND, +BS RKY:HCWCB all extremities without pain. No LE edema. Calves equal in size and NT.  Psych: A&Ox3  Skin:Scattered abrasions that are dressed. Otherwiseno rashes noted, warm and dry  Lab Results:  Recent Labs    10/16/19 0240 10/17/19 0113  WBC 10.3 13.0*  HGB 11.6* 11.3*  HCT 33.5* 33.6*  PLT 181 205   BMET Recent Labs    10/16/19 0240 10/17/19 0113  NA 136 138  K 4.4 4.4  CL 103 106  CO2 24 24  GLUCOSE 186* 190*  BUN 14 13  CREATININE 0.80 0.77  CALCIUM 9.4 9.1   PT/INR Recent Labs    10/14/19 1312 10/16/19 0240  LABPROT 15.5* 14.5  INR 1.3* 1.2   CMP     Component Value Date/Time   NA 138 10/17/2019 0113   K 4.4 10/17/2019 0113   CL 106 10/17/2019 0113   CO2 24 10/17/2019 0113   GLUCOSE 190 (H) 10/17/2019 0113   BUN 13 10/17/2019 0113   CREATININE 0.77 10/17/2019 0113   CALCIUM 9.1 10/17/2019 0113   PROT 6.5 10/17/2019 0113   ALBUMIN 3.0 (L) 10/17/2019 0113   AST 32 10/17/2019 0113   ALT 238 (H) 10/17/2019 0113   ALKPHOS 54 10/17/2019 0113   BILITOT 0.5  10/17/2019 0113   GFRNONAA >60 10/17/2019 0113   Lipase  No results found for: LIPASE     Studies/Results: DG Abd 1 View  Result Date: 10/15/2019 CLINICAL DATA:  IVC filter placement EXAM: DG C-ARM 1-60 MIN; ABDOMEN - 1 VIEW FLUOROSCOPY TIME:  Number of Acquired Spot Images: 2 COMPARISON:  CT abdomen pelvis 10/14/2019 FINDINGS: Sequential fluoroscopic images depict placement of a retrievable inferior vena cava filter with the superior extent position at the L2 level corresponding to an infrarenal positioning on comparison CT. No significant filter tilt, fracture or other acute complication is evident on these fluoroscopic images. Remaining osseous structures and soft tissues are unremarkable. IMPRESSION: Retrievable inferior vena cava filter placement. See operative report for details. Electronically Signed   By: Kreg Shropshire M.D.   On: 10/15/2019 17:41   DG Chest Port 1 View  Result Date: 10/16/2019 CLINICAL DATA:  Short of breath EXAM: PORTABLE CHEST 1 VIEW COMPARISON:  10/15/2019 FINDINGS: Improved lung volume. Improved aeration of the lungs with decreased bibasilar atelectasis. Negative for heart failure or effusion.  No new findings. IMPRESSION: Improvement in bibasilar atelectasis.  Improved lung volumes. Electronically Signed   By: Marlan Palau M.D.   On: 10/16/2019 14:12   DG C-Arm 1-60 Min  Result Date: 10/15/2019 CLINICAL DATA:  IVC filter placement EXAM: DG C-ARM  1-60 MIN; ABDOMEN - 1 VIEW FLUOROSCOPY TIME:  Number of Acquired Spot Images: 2 COMPARISON:  CT abdomen pelvis 10/14/2019 FINDINGS: Sequential fluoroscopic images depict placement of a retrievable inferior vena cava filter with the superior extent position at the L2 level corresponding to an infrarenal positioning on comparison CT. No significant filter tilt, fracture or other acute complication is evident on these fluoroscopic images. Remaining osseous structures and soft tissues are unremarkable. IMPRESSION: Retrievable  inferior vena cava filter placement. See operative report for details. Electronically Signed   By: Kreg ShropshirePrice  DeHay M.D.   On: 10/15/2019 17:41   VAS US LOWER EXTREMITY VENOUS (DVT)  Result Date: 10/16/2019  Lower Venous DVTStudy Indications: Pulmonary embolism.  Risk Factors: COVID 19 positive Trauma. Comparison Study: No prior studies. Performing Technologist: Chanda BusingGregory Collins RVT  Examination Guidelines: A complete evaluation includes B-mode imaging, spectral Doppler, color Doppler, and power Doppler as needed of all accessible portions of each vessel. Bilateral testing is considered an integral part of a complete examination. Limited examinations for reoccurring indications may be performed as noted. The reflux portion of the exam is performed with the patient in reverse Trendelenburg.  +---------+---------------+---------+-----------+----------+--------------+ RIGHT    CompressibilityPhasicitySpontaneityPropertiesThrombus Aging +---------+---------------+---------+-----------+----------+--------------+ CFV      Full           Yes      Yes                                 +---------+---------------+---------+-----------+----------+--------------+ SFJ      Full                                                        +---------+---------------+---------+-----------+----------+--------------+ FV Prox  Full                                                        +---------+---------------+---------+-----------+----------+--------------+ FV Mid   Full                                                        +---------+---------------+---------+-----------+----------+--------------+ FV DistalFull                                                        +---------+---------------+---------+-----------+----------+--------------+ PFV      Full                                                        +---------+---------------+---------+-----------+----------+--------------+ POP       Full           Yes      Yes                                 +---------+---------------+---------+-----------+----------+--------------+  PTV      Full                                                        +---------+---------------+---------+-----------+----------+--------------+ PERO     Full                                                        +---------+---------------+---------+-----------+----------+--------------+   +---------+---------------+---------+-----------+----------+--------------+ LEFT     CompressibilityPhasicitySpontaneityPropertiesThrombus Aging +---------+---------------+---------+-----------+----------+--------------+ CFV      Full           Yes      Yes                                 +---------+---------------+---------+-----------+----------+--------------+ SFJ      Full                                                        +---------+---------------+---------+-----------+----------+--------------+ FV Prox  Full                                                        +---------+---------------+---------+-----------+----------+--------------+ FV Mid   Full                                                        +---------+---------------+---------+-----------+----------+--------------+ FV DistalFull                                                        +---------+---------------+---------+-----------+----------+--------------+ PFV      Full                                                        +---------+---------------+---------+-----------+----------+--------------+ POP      Full           Yes      Yes                                 +---------+---------------+---------+-----------+----------+--------------+ PTV      Full                                                        +---------+---------------+---------+-----------+----------+--------------+  PERO     Full                                                         +---------+---------------+---------+-----------+----------+--------------+     Summary: RIGHT: - There is no evidence of deep vein thrombosis in the lower extremity.  - No cystic structure found in the popliteal fossa.  LEFT: - There is no evidence of deep vein thrombosis in the lower extremity.  - No cystic structure found in the popliteal fossa.  *See table(s) above for measurements and observations. Electronically signed by Gretta Began MD on 10/16/2019 at 3:07:29 PM.    Final    ECHOCARDIOGRAM LIMITED  Result Date: 10/16/2019    ECHOCARDIOGRAM LIMITED REPORT   Patient Name:   David Gordon Date of Exam: 10/16/2019 Medical Rec #:  [id number]      Height:       66.0 in Accession #:    [id number]     Weight:       205.5 lb Date of Birth:  08-05-1985       BSA:          2.023 m Patient Age:    34 years       BP:           125/74 mmHg Patient Gender: M              HR:           117 bpm. Exam Location:  Inpatient Procedure: Limited Echo, Limited Color Doppler and Cardiac Doppler Indications:    CHF-Acute Diastolic 428.31 / I50.31  History:        Patient has no prior history of Echocardiogram examinations.                 Risk Factors:Non-Smoker. PE.  Sonographer:    Renella Cunas RDCS Referring Phys: [id number] [place] [facility]  Sonographer Comments: Covid positive. IMPRESSIONS  1. Left ventricular ejection fraction, by estimation, is 60 to 65%. The left ventricle has normal function. The left ventricle has no regional wall motion abnormalities. Left ventricular diastolic parameters were normal.  2. Right ventricular systolic function is normal. The right ventricular size is normal.  3. The mitral valve is normal in structure. No evidence of mitral valve regurgitation.  4. The aortic valve is tricuspid. Aortic valve regurgitation is not visualized. No aortic stenosis is present.  5. The inferior vena cava is normal in size with greater than 50% respiratory variability, suggesting right atrial pressure  of 3 mmHg. Comparison(s): No prior Echocardiogram. FINDINGS  Left Ventricle: Left ventricular ejection fraction, by estimation, is 60 to 65%. The left ventricle has normal function. The left ventricle has no regional wall motion abnormalities. The left ventricular internal cavity size was normal in size. There is  no left ventricular hypertrophy. Left ventricular diastolic parameters were normal. Right Ventricle: The right ventricular size is normal. No increase in right ventricular wall thickness. Right ventricular systolic function is normal. Left Atrium: Left atrial size was normal in size. Right Atrium: Right atrial size was normal in size. Mitral Valve: The mitral valve is normal in structure. Tricuspid Valve: The tricuspid valve is normal in structure. Tricuspid valve regurgitation is trivial. Aortic Valve: The aortic valve is tricuspid. Aortic valve regurgitation is not visualized. No aortic stenosis is  present. Pulmonic Valve: The pulmonic valve was normal in structure. Pulmonic valve regurgitation is not visualized. Aorta: The aortic root and ascending aorta are structurally normal, with no evidence of dilitation. Venous: The inferior vena cava is normal in size with greater than 50% respiratory variability, suggesting right atrial pressure of 3 mmHg. LEFT VENTRICLE PLAX 2D LVIDd:         4.90 cm      Diastology LVIDs:         3.50 cm      LV e' medial:    11.00 cm/s LV PW:         0.80 cm      LV E/e' medial:  7.1 LV IVS:        0.80 cm      LV e' lateral:   13.50 cm/s LVOT diam:     2.20 cm      LV E/e' lateral: 5.8 LV SV:         79 LV SV Index:   39 LVOT Area:     3.80 cm  LV Volumes (MOD) LV vol d, MOD A2C: 96.3 ml LV vol d, MOD A4C: 102.0 ml LV vol s, MOD A2C: 34.4 ml LV vol s, MOD A4C: 39.9 ml LV SV MOD A2C:     61.9 ml LV SV MOD A4C:     102.0 ml LV SV MOD BP:      62.7 ml RIGHT VENTRICLE RV S prime:     24.60 cm/s TAPSE (M-mode): 2.4 cm LEFT ATRIUM         Index LA diam:    3.60 cm 1.78 cm/m   AORTIC VALVE LVOT Vmax:   138.00 cm/s LVOT Vmean:  88.700 cm/s LVOT VTI:    0.208 m  AORTA Ao Root diam: 3.40 cm MITRAL VALVE MV Area (PHT): 4.86 cm    SHUNTS MV Decel Time: 156 msec    Systemic VTI:  0.21 m MV E velocity: 78.40 cm/s  Systemic Diam: 2.20 cm MV A velocity: 63.80 cm/s MV E/A ratio:  1.23 Laurance Flatten MD Electronically signed by Laurance Flatten MD Signature Date/Time: 10/16/2019/5:09:16 PM    Final     Anti-infectives: Anti-infectives (From admission, onward)   Start     Dose/Rate Route Frequency Ordered Stop   10/17/19 1400  cephALEXin (KEFLEX) capsule 500 mg       "Followed by" Linked Group Details   500 mg Oral Every 8 hours 10/16/19 1317 10/21/19 1359   10/16/19 1415  ceFAZolin (ANCEF) IVPB 1 g/50 mL premix       "Followed by" Linked Group Details   1 g 100 mL/hr over 30 Minutes Intravenous Every 8 hours 10/16/19 1317 10/17/19 0639   10/15/19 1000  remdesivir 100 mg in sodium chloride 0.9 % 100 mL IVPB       "Followed by" Linked Group Details   100 mg 200 mL/hr over 30 Minutes Intravenous Daily 10/14/19 1823 10/19/19 0959   10/14/19 2000  remdesivir 200 mg in sodium chloride 0.9% 250 mL IVPB       "Followed by" Linked Group Details   200 mg 580 mL/hr over 30 Minutes Intravenous Once 10/14/19 1823 10/15/19 0718       Assessment/Plan PHBC R rib FX 5-9- multimodal pain control. Pulm toilet.IS/flutter valve. Grade 4 liver laceration-Stable on repeat CT 10/11. hgb stable at 11.3. Off bedrest 10/13. PT to see today.  COVID+ -Appreciate TRH assistance. On Remdesivir and Solumedrol.  PE - S/p  IVC filter placement 10/12. Unable to get anticoagulated 2/2 G4 Liver Lac. F/u w/ vascular as outpatient Tachycardia - sinus tach on monitor and EKG. Unclear source, possibly from PE or COVID? Hgb stable. No abd pain and he is tolerating a reg diet. Low suspicion of free intra-peritoneal bleed into the abdomen. Patient w/ normal kidney function and good UOP. Afebrile. No  hypotension or hypoxia on RA. Started on abx for staph epi UTI (? contamination as he is asymptomatic). BNP and TSH wnl. Echo w/ EF 60-65% and no valvular or wall motion abn. Appreciate TRH assistance. May benefit from scheduled Lopressor?  UTI - UCx w/ staph epi, pansesitive. Abx  FEN -IVF. Reg diet. VTE -SCDs, chemical prophylaxis on hold 2/2 liver lac ID -Ancef for UTI Foley - None Dispo - Monitor tachycardia. Appreciate TRH and Vascular assistance. Off bedrest. PT.    LOS: 5 days    Jacinto Halim , [facility] 10/17/2019, 9:44 AM Please see Amion for pager number during day hours 7:00am-4:30pm

## 2019-10-17 NOTE — Plan of Care (Signed)
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
## 2019-10-17 NOTE — Progress Notes (Signed)
   10/17/19 1913  Assess: MEWS Score  Temp 97.9 F (36.6 C)  BP (!) 142/80  Pulse Rate (!) 110  ECG Heart Rate (!) 108  Resp (!) 35  Level of Consciousness Alert  SpO2 96 %  O2 Device Room Air  Patient Activity (if Appropriate) In bed  Assess: MEWS Score  MEWS Temp 0  MEWS Systolic 0  MEWS Pulse 1  MEWS RR 2  MEWS LOC 0  MEWS Score 3  MEWS Score Color Yellow  Assess: if the MEWS score is Yellow or Red  Were vital signs taken at a resting state? Yes  Focused Assessment No change from prior assessment  Early Detection of Sepsis Score *See Row Information* Medium  MEWS guidelines implemented *See Row Information* Yes  Treat  MEWS Interventions Other (Comment) (Started yellow MEWS guidelines)  Pain Scale 0-10  Pain Score 0  Take Vital Signs  Increase Vital Sign Frequency  Yellow: Q 2hr X 2 then Q 4hr X 2, if remains yellow, continue Q 4hrs  Escalate  MEWS: Escalate Yellow: discuss with charge nurse/RN and consider discussing with provider and RRT  Notify: Charge Nurse/RN  Name of Charge Nurse/RN Notified Joni Reining, Consulting civil engineer  Date Charge Nurse/RN Notified 10/17/19  Time Charge Nurse/RN Notified 1937  Document  Patient Outcome Other (Comment) (Yellow MEWS monitoring)  Progress note created (see row info) Yes   Discussed w/ Joni Reining, Consulting civil engineer and increasing VS collecting to [employer] x2 then Q4hr x2; assessment shows no acute changes and pt remains on bedside chair on RA. Will continue to monitor.
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
## 2019-10-18 ENCOUNTER — Other Ambulatory Visit (HOSPITAL_COMMUNITY): Payer: Self-pay | Admitting: General Surgery

## 2019-10-18 LAB — COMPREHENSIVE METABOLIC PANEL
ALT: 169 U/L — ABNORMAL HIGH (ref 0–44)
AST: 24 U/L (ref 15–41)
Albumin: 3 g/dL — ABNORMAL LOW (ref 3.5–5.0)
Alkaline Phosphatase: 62 U/L (ref 38–126)
Anion gap: 7 (ref 5–15)
BUN: 14 mg/dL (ref 6–20)
CO2: 24 mmol/L (ref 22–32)
Calcium: 9 mg/dL (ref 8.9–10.3)
Chloride: 103 mmol/L (ref 98–111)
Creatinine, Ser: 0.72 mg/dL (ref 0.61–1.24)
GFR, Estimated: >60 mL/min (ref >60)
Glucose, Bld: 211 mg/dL — ABNORMAL HIGH (ref 70–99)
Potassium: 4.5 mmol/L (ref 3.5–5.1)
Sodium: 134 mmol/L — ABNORMAL LOW (ref 135–145)
Total Bilirubin: 0.4 mg/dL (ref 0.3–1.2)
Total Protein: 6.2 g/dL — ABNORMAL LOW (ref 6.5–8.1)

## 2019-10-18 LAB — CBC WITH DIFFERENTIAL/PLATELET
Abs Immature Granulocytes: 0.31 10*3/uL — ABNORMAL HIGH (ref 0.00–0.07)
Basophils Absolute: 0 10*3/uL (ref 0.0–0.1)
Basophils Relative: 0 %
Eosinophils Absolute: 0 10*3/uL (ref 0.0–0.5)
Eosinophils Relative: 0 %
HCT: 34.8 % — ABNORMAL LOW (ref 39.0–52.0)
Hemoglobin: 12.1 g/dL — ABNORMAL LOW (ref 13.0–17.0)
Immature Granulocytes: 2 %
Lymphocytes Relative: 9 %
Lymphs Abs: 1.2 10*3/uL (ref 0.7–4.0)
MCH: 28.7 pg (ref 26.0–34.0)
MCHC: 34.8 g/dL (ref 30.0–36.0)
MCV: 82.5 fL (ref 80.0–100.0)
Monocytes Absolute: 1.2 10*3/uL — ABNORMAL HIGH (ref 0.1–1.0)
Monocytes Relative: 9 %
Neutro Abs: 10.7 10*3/uL — ABNORMAL HIGH (ref 1.7–7.7)
Neutrophils Relative %: 80 %
Platelets: 214 10*3/uL (ref 150–400)
RBC: 4.22 MIL/uL (ref 4.22–5.81)
RDW: 12.5 % (ref 11.5–15.5)
WBC: 13.4 10*3/uL — ABNORMAL HIGH (ref 4.0–10.5)
nRBC: 0.6 % — ABNORMAL HIGH (ref 0.0–0.2)

## 2019-10-18 LAB — C-REACTIVE PROTEIN: CRP: 1.8 mg/dL — ABNORMAL HIGH (ref <1.0)

## 2019-10-18 LAB — BRAIN NATRIURETIC PEPTIDE: B Natriuretic Peptide: 74.5 pg/mL (ref 0.0–100.0)

## 2019-10-18 LAB — PROCALCITONIN: Procalcitonin: <0.1 ng/mL

## 2019-10-18 MED ORDER — OXYCODONE HCL 5 MG PO TABS
5.0000 mg | ORAL_TABLET | Freq: Four times a day (QID) | ORAL | 0 refills | Status: DC | PRN
Start: 2019-10-18 — End: 2019-10-18

## 2019-10-18 MED ORDER — CEPHALEXIN 500 MG PO CAPS: 500.0000 mg | ORAL_CAPSULE | Freq: Three times a day (TID) | ORAL | 0 refills | Status: DC

## 2019-10-18 MED ORDER — METHOCARBAMOL 500 MG PO TABS: 500.0000 mg | ORAL_TABLET | Freq: Three times a day (TID) | ORAL | 0 refills | Status: DC | PRN

## 2019-10-18 MED ORDER — LACTATED RINGERS IV BOLUS
500.0000 mL | Freq: Once | INTRAVENOUS | Status: AC
  Administered 2019-10-18: 500 mL via INTRAVENOUS

## 2019-10-18 MED FILL — oxyCODONE HCL 5 MG TABS: 5 | 3 days supply | Qty: 15 | Fill #0

## 2019-10-18 MED FILL — CEPHALEXIN 500 MG CAPS: 500 | 4 days supply | Qty: 12 | Fill #0

## 2019-10-18 MED FILL — METHOCARBAMOL 500 MG TABS: 500 | 10 days supply | Qty: 30 | Fill #0

## 2019-10-18 NOTE — Care Management Important Message (Signed)
Important Message  Patient Details  Name: David Gordon MRN: [id number] Date of Birth: [birth year]   Medicare Important Message Given:  Yes - Important Message mailed due to current National Emergency  Verbal consent obtained due to current National Emergency  Relationship to patient: Self Contact Name: Nyeem Stoke Call Date: 10/18/19  Time: 1146 Phone: [phone] Outcome: No Answer/Busy Important Message mailed to: Patient address on file    Orson Aloe 10/18/2019, 11:46 AM

## 2019-10-18 NOTE — TOC CAGE-AID Note (Signed)
Transition of Care [facility]) - CAGE-AID Screening   Patient Details  Name: David Gordon MRN: [id number] Date of Birth: [birth year]  Transition of [facility]) CM/SW Contact:    Jimmy Picket, LCSWA Phone Number: 10/18/2019, 11:07 AM   Clinical Narrative:  CSW spoke with pt via phone. CSW introduced self and explained her role at the hospital.  Pt denied alcohol use and substance use. Pt did not need resources at this time.   CAGE-AID Screening:    Have You Ever Felt You Ought to Cut Down on Your Drinking or Drug Use?: No Have People Annoyed You By Critizing Your Drinking Or Drug Use?: No Have You Felt Bad Or Guilty About Your Drinking Or Drug Use?: No Have You Ever Had a Drink or Used Drugs First Thing In The Morning to Steady Your Nerves or to Get Rid of a Hangover?: No CAGE-AID Score: 0  Substance Abuse Education Offered: Yes    Denita Lung, Bridget Hartshorn Clinical Social Worker [phone]

## 2019-10-18 NOTE — Discharge Instructions (Signed)
COVID-19 COVID-19 is a respiratory infection that is caused by a virus called severe acute respiratory syndrome coronavirus 2 (SARS-CoV-2). The disease is also known as coronavirus disease or novel coronavirus. In some people, the virus may not cause any symptoms. In others, it may cause a serious infection. The infection can get worse quickly and can lead to complications, such as:  Pneumonia, or infection of the lungs.  Acute respiratory distress syndrome or ARDS. This is a condition in which fluid build-up in the lungs prevents the lungs from filling with air and passing oxygen into the blood.  Acute respiratory failure. This is a condition in which there is not enough oxygen passing from the lungs to the body or when carbon dioxide is not passing from the lungs out of the body.  Sepsis or septic shock. This is a serious bodily reaction to an infection.  Blood clotting problems.  Secondary infections due to bacteria or fungus.  Organ failure. This is when your body's organs stop working. The virus that causes COVID-19 is contagious. This means that it can spread from person to person through droplets from coughs and sneezes (respiratory secretions). What are the causes? This illness is caused by a virus. You may catch the virus by:  Breathing in droplets from an infected person. Droplets can be spread by a person breathing, speaking, singing, coughing, or sneezing.  Touching something, like a table or a doorknob, that was exposed to the virus (contaminated) and then touching your mouth, nose, or eyes. What increases the risk? Risk for infection You are more likely to be infected with this virus if you:  Are within 6 feet (2 meters) of a person with COVID-19.  Provide care for or live with a person who is infected with COVID-19.  Spend time in crowded indoor spaces or live in shared housing. Risk for serious illness You are more likely to become seriously ill from the virus if  you:  Are 50 years of age or older. The higher your age, the more you are at risk for serious illness.  Live in a nursing home or long-term care facility.  Have cancer.  Have a long-term (chronic) disease such as: ? Chronic lung disease, including chronic obstructive pulmonary disease or asthma. ? A long-term disease that lowers your body's ability to fight infection (immunocompromised). ? Heart disease, including heart failure, a condition in which the arteries that lead to the heart become narrow or blocked (coronary artery disease), a disease which makes the heart muscle thick, weak, or stiff (cardiomyopathy). ? Diabetes. ? Chronic kidney disease. ? Sickle cell disease, a condition in which red blood cells have an abnormal "sickle" shape. ? Liver disease.  Are obese. What are the signs or symptoms? Symptoms of this condition can range from mild to severe. Symptoms may appear any time from 2 to 14 days after being exposed to the virus. They include:  A fever or chills.  A cough.  Difficulty breathing.  Headaches, body aches, or muscle aches.  Runny or stuffy (congested) nose.  A sore throat.  New loss of taste or smell. Some people may also have stomach problems, such as nausea, vomiting, or diarrhea. Other people may not have any symptoms of COVID-19. How is this diagnosed? This condition may be diagnosed based on:  Your signs and symptoms, especially if: ? You live in an area with a COVID-19 outbreak. ? You recently traveled to or from an area where the virus is common. ? You   provide care for or live with a person who was diagnosed with COVID-19. ? You were exposed to a person who was diagnosed with COVID-19.  A physical exam.  Lab tests, which may include: ? Taking a sample of fluid from the back of your nose and throat (nasopharyngeal fluid), your nose, or your throat using a swab. ? A sample of mucus from your lungs (sputum). ? Blood tests.  Imaging tests,  which may include, X-rays, CT scan, or ultrasound. How is this treated? At present, there is no medicine to treat COVID-19. Medicines that treat other diseases are being used on a trial basis to see if they are effective against COVID-19. Your health care provider will talk with you about ways to treat your symptoms. For most people, the infection is mild and can be managed at home with rest, fluids, and over-the-counter medicines. Treatment for a serious infection usually takes places in a hospital intensive care unit (ICU). It may include one or more of the following treatments. These treatments are given until your symptoms improve.  Receiving fluids and medicines through an IV.  Supplemental oxygen. Extra oxygen is given through a tube in the nose, a face mask, or a hood.  Positioning you to lie on your stomach (prone position). This makes it easier for oxygen to get into the lungs.  Continuous positive airway pressure (CPAP) or bi-level positive airway pressure (BPAP) machine. This treatment uses mild air pressure to keep the airways open. A tube that is connected to a motor delivers oxygen to the body.  Ventilator. This treatment moves air into and out of the lungs by using a tube that is placed in your windpipe.  Tracheostomy. This is a procedure to create a hole in the neck so that a breathing tube can be inserted.  Extracorporeal membrane oxygenation (ECMO). This procedure gives the lungs a chance to recover by taking over the functions of the heart and lungs. It supplies oxygen to the body and removes carbon dioxide. Follow these instructions at home: Lifestyle  If you are sick, stay home except to get medical care. Your health care provider will tell you how long to stay home. Call your health care provider before you go for medical care.  Rest at home as told by your health care provider.  Do not use any products that contain nicotine or tobacco, such as cigarettes,  e-cigarettes, and chewing tobacco. If you need help quitting, ask your health care provider.  Return to your normal activities as told by your health care provider. Ask your health care provider what activities are safe for you. General instructions  Take over-the-counter and prescription medicines only as told by your health care provider.  Drink enough fluid to keep your urine pale yellow.  Keep all follow-up visits as told by your health care provider. This is important. How is this prevented?  There is no vaccine to help prevent COVID-19 infection. However, there are steps you can take to protect yourself and others from this virus. To protect yourself:   Do not travel to areas where COVID-19 is a risk. The areas where COVID-19 is reported change often. To identify high-risk areas and travel restrictions, check the CDC travel website: wwwnc.cdc.gov/travel/notices  If you live in, or must travel to, an area where COVID-19 is a risk, take precautions to avoid infection. ? Stay away from people who are sick. ? Wash your hands often with soap and water for 20 seconds. If soap and water   are not available, use an alcohol-based hand sanitizer. ? Avoid touching your mouth, face, eyes, or nose. ? Avoid going out in public, follow guidance from your state and local health authorities. ? If you must go out in public, wear a cloth face covering or face mask. Make sure your mask covers your nose and mouth. ? Avoid crowded indoor spaces. Stay at least 6 feet (2 meters) away from others. ? Disinfect objects and surfaces that are frequently touched every day. This may include:  Counters and tables.  Doorknobs and light switches.  Sinks and faucets.  Electronics, such as phones, remote controls, keyboards, computers, and tablets. To protect others: If you have symptoms of COVID-19, take steps to prevent the virus from spreading to others.  If you think you have a COVID-19 infection, contact  your health care provider right away. Tell your health care team that you think you may have a COVID-19 infection.  Stay home. Leave your house only to seek medical care. Do not use public transport.  Do not travel while you are sick.  Wash your hands often with soap and water for 20 seconds. If soap and water are not available, use alcohol-based hand sanitizer.  Stay away from other members of your household. Let healthy household members care for children and pets, if possible. If you have to care for children or pets, wash your hands often and wear a mask. If possible, stay in your own room, separate from others. Use a different bathroom.  Make sure that all people in your household wash their hands well and often.  Cough or sneeze into a tissue or your sleeve or elbow. Do not cough or sneeze into your hand or into the air.  Wear a cloth face covering or face mask. Make sure your mask covers your nose and mouth. Where to find more information  Centers for Disease Control and Prevention: www.cdc.gov/coronavirus/2019-ncov/index.html  World Health Organization: www.who.int/health-topics/coronavirus Contact a health care provider if:  You live in or have traveled to an area where COVID-19 is a risk and you have symptoms of the infection.  You have had contact with someone who has COVID-19 and you have symptoms of the infection. Get help right away if:  You have trouble breathing.  You have pain or pressure in your chest.  You have confusion.  You have bluish lips and fingernails.  You have difficulty waking from sleep.  You have symptoms that get worse. These symptoms may represent a serious problem that is an emergency. Do not wait to see if the symptoms will go away. Get medical help right away. Call your local emergency services (911 in the [place] Do not drive yourself to the hospital. Let the emergency medical personnel know if you think you have  COVID-19. Summary  COVID-19 is a respiratory infection that is caused by a virus. It is also known as coronavirus disease or novel coronavirus. It can cause serious infections, such as pneumonia, acute respiratory distress syndrome, acute respiratory failure, or sepsis.  The virus that causes COVID-19 is contagious. This means that it can spread from person to person through droplets from breathing, speaking, singing, coughing, or sneezing.  You are more likely to develop a serious illness if you are 50 years of age or older, have a weak immune system, live in a nursing home, or have chronic disease.  There is no medicine to treat COVID-19. Your health care provider will talk with you about ways to treat your symptoms.    Take steps to protect yourself and others from infection. Wash your hands often and disinfect objects and surfaces that are frequently touched every day. Stay away from people who are sick and wear a mask if you are sick. This information is not intended to replace advice given to you by your health care provider. Make sure you discuss any questions you have with your health care provider. Document Revised: 10/19/2018 Document Reviewed: 01/25/2018 Elsevier Patient Education  2020 Elsevier Inc.     RIB FRACTURES  HOME INSTRUCTIONS   1. PAIN CONTROL:  1. Pain is best controlled by a usual combination of three different methods TOGETHER:  i. Ice/Heat ii. Over the counter pain medication iii. Prescription pain medication 2. You may experience some swelling and bruising in area of broken ribs. Ice packs or heating pads (30-60 minutes up to 6 times a day) will help. Use ice for the first few days to help decrease swelling and bruising, then switch to heat to help relax tight/sore spots and speed recovery. Some people prefer to use ice alone, heat alone, alternating between ice & heat. Experiment to what works for you. Swelling and bruising can take several weeks to resolve.   3. It is helpful to take an over-the-counter pain medication regularly for the first few weeks. Choose one of the following that works best for you:  i. Naproxen (Aleve, etc) Two 220mg  tabs twice a day ii. Ibuprofen (Advil, etc) Three 200mg  tabs four times a day (every meal & bedtime) iii. Acetaminophen (Tylenol, etc) 500-650mg  four times a day (every meal & bedtime) 4. A prescription for pain medication (such as oxycodone, hydrocodone, etc) may be given to you upon discharge. Take your pain medication as prescribed.  i. If you are having problems/concerns with the prescription medicine (does not control pain, nausea, vomiting, rash, itching, etc), please call [phone] to see if we need to switch you to a different pain medicine that will work better for you and/or control your side effect better. ii. If you need a refill on your pain medication, please contact your pharmacy. They will contact our office to request authorization. Prescriptions will not be filled after 5 pm or on week-ends. 1. Avoid getting constipated. When taking pain medications, it is common to experience some constipation. Increasing fluid intake and taking a fiber supplement (such as Metamucil, Citrucel, FiberCon, MiraLax, etc) 1-2 times a day regularly will usually help prevent this problem from occurring. A mild laxative (prune juice, Milk of Magnesia, MiraLax, etc) should be taken according to package directions if there are no bowel movements after 48 hours.  2. Watch out for diarrhea. If you have many loose bowel movements, simplify your diet to bland foods & liquids for a few days. Stop any stool softeners and decrease your fiber supplement. Switching to mild anti-diarrheal medications (Kayopectate, Pepto Bismol) can help. If this worsens or does not improve, please call us. 3. FOLLOW UP  a. If a follow up appointment is needed one will be scheduled for you. If none is needed with our trauma team, please follow up  with your primary care provider within 2-3 weeks from discharge. Please call [facility] at [phone] if you have any questions about follow up.  b. If you have any orthopedic or other injuries you will need to follow up as outlined in your follow up instructions.   WHEN TO CALL us [phone]:  1. Poor pain control 2. Reactions / problems with new medications (rash/itching,  nausea, etc)  3. Fever over 101.5 F (38.5 C) 4. Worsening swelling or bruising 5. Worsening pain, productive cough, difficulty breathing or any other concerning symptoms  The clinic staff is available to answer your questions during regular business hours (8:30am-5pm). Please don't hesitate to call and ask to speak to one of our nurses for clinical concerns.  If you have a medical emergency, go to the nearest emergency room or call 911.  A surgeon from [facility] is always on call at the [facility], [place]  [address], [place], [place] [address] ?  MAIN: [phone] ? TOLL FREE: [phone] ?  FAX [phone]  [email or web address]      Information on Rib Fractures  A rib fracture is a break or crack in one of the bones of the ribs. The ribs are long, curved bones that wrap around your chest and attach to your spine and your breastbone. The ribs protect your heart, lungs, and other organs in the chest. A broken or cracked rib is often painful but is not usually serious. Most rib fractures heal on their own over time. However, rib fractures can be more serious if multiple ribs are broken or if broken ribs move out of place and push against other structures or organs. What are the causes? This condition is caused by:  Repetitive movements with high force, such as pitching a baseball or having severe coughing spells.  A direct blow to the chest, such as a sports injury, a car accident, or a fall.  Cancer that has spread to the bones, which  can weaken bones and cause them to break. What are the signs or symptoms? Symptoms of this condition include:  Pain when you breathe in or cough.  Pain when someone presses on the injured area.  Feeling short of breath. How is this diagnosed? This condition is diagnosed with a physical exam and medical history. Imaging tests may also be done, such as:  Chest X-ray.  CT scan.  MRI.  Bone scan.  Chest ultrasound. How is this treated? Treatment for this condition depends on the severity of the fracture. Most rib fractures usually heal on their own in 1-3 months. Sometimes healing takes longer if there is a cough that does not stop or if there are other activities that make the injury worse (aggravating factors). While you heal, you will be given medicines to control the pain. You will also be taught deep breathing exercises. Severe injuries may require hospitalization or surgery. Follow these instructions at home: Managing pain, stiffness, and swelling  If directed, apply ice to the injured area. ? Put ice in a plastic bag. ? Place a towel between your skin and the bag. ? Leave the ice on for 20 minutes, 2-3 times a day.  Take over-the-counter and prescription medicines only as told by your health care provider. Activity  Avoid a lot of activity and any activities or movements that cause pain. Be careful during activities and avoid bumping the injured rib.  Slowly increase your activity as told by your health care provider. General instructions  Do deep breathing exercises as told by your health care provider. This helps prevent pneumonia, which is a common complication of a broken rib. Your health care provider may instruct you to: ? Take deep breaths several times a day. ? Try to cough several times a day, holding a pillow against the injured area. ? Use a device called incentive  spirometer to practice deep breathing several times a day.  Drink enough fluid to keep your  urine pale yellow.  Do not wear a rib belt or binder. These restrict breathing, which can lead to pneumonia.  Keep all follow-up visits as told by your health care provider. This is important. Contact a health care provider if:  You have a fever. Get help right away if:  You have difficulty breathing or you are short of breath.  You develop a cough that does not stop, or you cough up thick or bloody sputum.  You have nausea, vomiting, or pain in your abdomen.  Your pain gets worse and medicine does not help. Summary  A rib fracture is a break or crack in one of the bones of the ribs.  A broken or cracked rib is often painful but is not usually serious.  Most rib fractures heal on their own over time.  Treatment for this condition depends on the severity of the fracture.  Avoid a lot of activity and any activities or movements that cause pain. This information is not intended to replace advice given to you by your health care provider. Make sure you discuss any questions you have with your health care provider. Document Released: 12/20/2004 Document Revised: 03/21/2016 Document Reviewed: 03/21/2016 Elsevier Interactive Patient Education  2019 Elsevier Inc.   Liver Laceration  A liver laceration is a tear or a cut in the liver. The liver is an organ that is involved in many important bodily functions. Sometimes, a liver laceration can be a very serious injury. It can cause a lot of bleeding, and surgery may be needed. Other times, a liver laceration may be minor, and bed rest may be all that is needed. Either way, treatment in a hospital is almost always required. Liver lacerations are categorized in grades from 1 to 5. Low numbers identify lacerations that are less severe than lacerations with high numbers.  Grade 1: This is a tear in the outer lining of the liver. It is less than  inch (1 cm) deep.  Grade 2: This is a tear that is about  inch to 1 inch (1 to 3 cm) deep. It  is less than 4 inches (10 cm) long.  Grade 3: This is a tear that is slightly more than 1 inch (3 cm) deep.  Grades 4 and 5: These lacerations are very deep. They affect a large part of the liver. What are the causes? This condition may be caused by:  A forceful hit to the area around the liver (blunt trauma), such as in a car crash. Blunt trauma can tear the liver even though it does not break the skin.  An injury in which an object goes through the skin and into the liver (penetrating injury), such as a stab or gunshot wound. What are the signs or symptoms? Common symptoms of this condition include:  A swollen and firm abdomen.  Pain in the abdomen.  Tenderness when pressing on the right side of the abdomen. Other symptoms include:  Bleeding from a penetrating wound.  Bruises on the abdomen.  A fast heartbeat.  Taking quick breaths.  Feeling weak and dizzy. How is this diagnosed? To diagnose this condition, your health care provider will do a physical exam and ask about any injuries to the right side of your abdomen. You may have various tests, such as:  Blood tests. Your blood may be tested every few hours. This will show whether you are  losing blood.  CT scan. This test is done to check for laceration or bleeding.  Laparoscopy. This involves placing a small camera into the abdomen and looking directly at the surface of the liver. How is this treated? Treatment depends on how deep the laceration is and how much bleeding you have. Treatment options include:  Monitoring and bed rest at the hospital. You will have tests often.  Receiving donated blood through an IV tube (transfusion) to replace blood that you have lost. You may need several transfusions.  Surgery to pack gauze pads or special material around the laceration to help it heal or to repair the laceration. Follow these instructions at home:  Take over-the-counter and prescription medicines only as told by  your health care provider. Do not take any other medicines unless you ask your health care provider about them first.  Do not drive or use heavy machinery while taking prescription pain medicines.  Rest and limit your activity as told by your health care provider. It may be several months before you can return to your usual routine. Do not participate in activities that involve physical contact or require extra energy until your health care provider approves.  Keep all follow-up visits as told by your health care provider. This is important. Contact a health care provider if:  Your abdominal pain does not go away.  You feel more weak and tired than usual. Get help right away if:  Your abdominal pain gets worse.  You have a cut on your skin that: ? Has more redness, swelling, or pain around it. ? Has more fluid or blood coming from it. ? Feels warm to the touch. ? Has pus or a bad smell coming from it.  You feel dizzy or very weak.  You have trouble breathing.  You have a fever. This information is not intended to replace advice given to you by your health care provider. Make sure you discuss any questions you have with your health care provider. Document Revised: 12/02/2016 Document Reviewed: 08/07/2015 Elsevier Patient Education  2020 Elsevier Inc.   Pulmonary Embolism  A pulmonary embolism (PE) is a sudden blockage or decrease of blood flow in one or both lungs. Most blockages come from a blood clot that forms in the vein of a lower leg, thigh, or arm (deep vein thrombosis, DVT) and travels to the lungs. A clot is blood that has thickened into a gel or solid. PE is a dangerous and life-threatening condition that needs to be treated right away. What are the causes? This condition is usually caused by a blood clot that forms in a vein and moves to the lungs. In rare cases, it may be caused by air, fat, part of a tumor, or other tissue that moves through the veins and into the  lungs. What increases the risk? The following factors may make you more likely to develop this condition:  Experiencing a traumatic injury, such as breaking a hip or leg.  Having: ? A spinal cord injury. ? Orthopedic surgery, especially hip or knee replacement. ? Any major surgery. ? A stroke. ? DVT. ? Blood clots or blood clotting disease. ? Long-term (chronic) lung or heart disease. ? Cancer treated with chemotherapy. ? A central venous catheter.  Taking medicines that contain estrogen. These include birth control pills and hormone replacement therapy.  Being: ? Pregnant. ? In the period of time after your baby is delivered (postpartum). ? Older than age 33. ? Overweight. ? A smoker,  especially if you have other risks. What are the signs or symptoms? Symptoms of this condition usually start suddenly and include:  Shortness of breath during activity or at rest.  Coughing, coughing up blood, or coughing up blood-tinged mucus.  Chest pain that is often worse with deep breaths.  Rapid or irregular heartbeat.  Feeling light-headed or dizzy.  Fainting.  Feeling anxious.  Fever.  Sweating.  Pain and swelling in a leg. This is a symptom of DVT, which can lead to PE. How is this diagnosed? This condition may be diagnosed based on:  Your medical history.  A physical exam.  Blood tests.  CT pulmonary angiogram. This test checks blood flow in and around your lungs.  Ventilation-perfusion scan, also called a lung VQ scan. This test measures air flow and blood flow to the lungs.  An ultrasound of the legs. How is this treated? Treatment for this condition depends on many factors, such as the cause of your PE, your risk for bleeding or developing more clots, and other medical conditions you have. Treatment aims to remove, dissolve, or stop blood clots from forming or growing larger. Treatment may include:  Medicines, such as: ? Blood thinning medicines  (anticoagulants) to stop clots from forming. ? Medicines that dissolve clots (thrombolytics).  Procedures, such as: ? Using a flexible tube to remove a blood clot (embolectomy) or to deliver medicine to destroy it (catheter-directed thrombolysis). ? Inserting a filter into a large vein that carries blood to the heart (inferior vena cava). This filter (vena cava filter) catches blood clots before they reach the lungs. ? Surgery to remove the clot (surgical embolectomy). This is rare. You may need a combination of immediate, long-term (up to 3 months after diagnosis), and extended (more than 3 months after diagnosis) treatments. Your treatment may continue for several months (maintenance therapy). You and your health care provider will work together to choose the treatment program that is best for you. Follow these instructions at home: Medicines  Take over-the-counter and prescription medicines only as told by your health care provider.  If you are taking an anticoagulant medicine: ? Take the medicine every day at the same time each day. ? Understand what foods and drugs interact with your medicine. ? Understand the side effects of this medicine, including excessive bruising or bleeding. Ask your health care provider or pharmacist about other side effects. General instructions  Wear a medical alert bracelet or carry a medical alert card that says you have had a PE and lists what medicines you take.  Ask your health care provider when you may return to your normal activities. Avoid sitting or lying for a long time without moving.  Maintain a healthy weight. Ask your health care provider what weight is healthy for you.  Do not use any products that contain nicotine or tobacco, such as cigarettes, e-cigarettes, and chewing tobacco. If you need help quitting, ask your health care provider.  Talk with your health care provider about any travel plans. It is important to make sure that you are  still able to take your medicine while on trips.  Keep all follow-up visits as told by your health care provider. This is important. Contact a health care provider if:  You missed a dose of your blood thinner medicine. Get help right away if:  You have: ? New or increased pain, swelling, warmth, or redness in an arm or leg. ? Numbness or tingling in an arm or leg. ? Shortness of  breath during activity or at rest. ? A fever. ? Chest pain. ? A rapid or irregular heartbeat. ? A severe headache. ? Vision changes. ? A serious fall or accident, or you hit your head. ? Stomach (abdominal) pain. ? Blood in your vomit, stool, or urine. ? A cut that will not stop bleeding.  You cough up blood.  You feel light-headed or dizzy.  You cannot move your arms or legs.  You are confused or have memory loss. These symptoms may represent a serious problem that is an emergency. Do not wait to see if the symptoms will go away. Get medical help right away. Call your local emergency services (911 in the [place] Do not drive yourself to the hospital. Summary  A pulmonary embolism (PE) is a sudden blockage or decrease of blood flow in one or both lungs. PE is a dangerous and life-threatening condition that needs to be treated right away.  Treatments for this condition usually include medicines to thin your blood (anticoagulants) or medicines to break apart blood clots (thrombolytics).  If you are given blood thinners, it is important to take the medicine every day at the same time each day.  Understand what foods and drugs interact with any medicines that you are taking.  If you have signs of PE or DVT, call your local emergency services (911 in the [place] This information is not intended to replace advice given to you by your health care provider. Make sure you discuss any questions you have with your health care provider. Document Revised: 09/27/2017 Document Reviewed: 09/27/2017 Elsevier Patient  Education  2020 [employer]   Inferior Vena Cava Filter Insertion, Care After This sheet gives you information about how to care for yourself after your procedure. Your health care provider may also give you more specific instructions. If you have problems or questions, contact your health care provider. What can I expect after the procedure? After your procedure, it is common to have:  Mild pain in the area where the filter was inserted.  Mild bruising in the area where the filter was inserted. Follow these instructions at home: Insertion site care   Follow instructions from your health care provider about how to take care of the site where a catheter was inserted at your neck or groin (insertion site). Make sure you: ? Wash your hands with soap and water before you change your bandage (dressing). If soap and water are not available, use hand sanitizer. ? Change your dressing as told by your health care provider.  Check your insertion site every day for signs of infection. Check for: ? More redness, swelling, or pain. ? More fluid or blood. ? Warmth. ? Pus or a bad smell.  Keep the insertion site clean and dry.  Do not shower, bathe, use a hot tub, or let the dressing get wet until your health care provider approves. General instructions  Take over-the-counter and prescription medicines only as told by your health care provider.  Avoid heavy lifting or hard activities for 48 hours after the procedure or as told by your health care provider.  Do not drive for 24 hours if you were given a medicine to help you relax (sedative).  Do not drive or use heavy machinery while taking prescription pain medicine.  Do not go back to school or work until your health care provider approves.  Keep all follow-up visits as told by your health care provider. This is important. Contact a health care provider if:  You have more redness, swelling, or pain around your insertion site.  You  have more fluid or blood coming from your insertion site.  Your insertion site feels warm to the touch.  You have pus or a bad smell coming from your insertion site.  You have a fever.  You are dizzy.  You have nausea and vomiting.  You develop a rash. Get help right away if:  You develop chest pain, a cough, or difficulty breathing.  You develop shortness of breath, feel faint, or pass out.  You cough up blood.  You have severe pain in your abdomen.  You develop swelling and discoloration or pain in your legs.  Your legs become pale and cold or blue.  You develop weakness, difficulty moving your arms or legs, or balance problems.  You develop problems with speech or vision. These symptoms may represent a serious problem that is an emergency. Do not wait to see if the symptoms will go away. Get medical help right away. Call your local emergency services (911 in the [place] Do not drive yourself to the hospital. Summary  After your insertion procedure, it is common to have mild pain and bruising.  Do not shower, bathe, use a hot tub, or let the dressing get wet until your health care provider approves.  Every day, check for signs of infection where a catheter was inserted at your neck or groin (insertion site). This information is not intended to replace advice given to you by your health care provider. Make sure you discuss any questions you have with your health care provider. Document Revised: 12/02/2016 Document Reviewed: 11/11/2015 Elsevier Patient Education  2020 [employer].
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
## 2019-10-19 LAB — CULTURE, BLOOD (ROUTINE X 2)
Culture: NO GROWTH
Culture: NO GROWTH

## 2019-11-15 ENCOUNTER — Ambulatory Visit (INDEPENDENT_AMBULATORY_CARE_PROVIDER_SITE_OTHER): Payer: Self-pay | Admitting: Physician Assistant

## 2019-11-15 ENCOUNTER — Other Ambulatory Visit: Payer: Self-pay

## 2019-11-15 VITALS — BP 129/82 | HR 110 | Temp 98.4°F | Resp 16 | Wt 200.0 lb

## 2019-11-15 DIAGNOSIS — I2699 Other pulmonary embolism without acute cor pulmonale: Secondary | ICD-10-CM
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
## 2019-11-19 ENCOUNTER — Other Ambulatory Visit: Payer: Self-pay

## 2019-12-05 ENCOUNTER — Encounter (HOSPITAL_COMMUNITY): Payer: Self-pay | Admitting: Vascular Surgery

## 2019-12-05 ENCOUNTER — Other Ambulatory Visit: Payer: Self-pay

## 2019-12-05 ENCOUNTER — Ambulatory Visit (HOSPITAL_COMMUNITY)
Admission: RE | Admit: 2019-12-05 | Discharge: 2019-12-05 | Disposition: A | Discharge status: Home / Self Care | Payer: Medicare HMO | Attending: Vascular Surgery | Admitting: Vascular Surgery

## 2019-12-05 ENCOUNTER — Ambulatory Visit (HOSPITAL_COMMUNITY): Payer: Medicare HMO

## 2019-12-05 ENCOUNTER — Encounter (HOSPITAL_COMMUNITY)
Admission: RE | Disposition: A | Discharge status: Home / Self Care | Payer: Self-pay | Source: Home / Self Care | Attending: Vascular Surgery

## 2019-12-05 DIAGNOSIS — Z8616 Personal history of COVID-19: Secondary | ICD-10-CM | POA: Insufficient documentation

## 2019-12-05 DIAGNOSIS — Z452 Encounter for adjustment and management of vascular access device: Secondary | ICD-10-CM | POA: Diagnosis not present

## 2019-12-05 DIAGNOSIS — Z95828 Presence of other vascular implants and grafts: Secondary | ICD-10-CM

## 2019-12-05 DIAGNOSIS — Z86711 Personal history of pulmonary embolism: Secondary | ICD-10-CM | POA: Diagnosis not present

## 2019-12-05 DIAGNOSIS — I2699 Other pulmonary embolism without acute cor pulmonale: Secondary | ICD-10-CM

## 2019-12-05 HISTORY — PX: IVC FILTER REMOVAL: CATH118246

## 2019-12-05 LAB — POCT I-STAT, CHEM 8
BUN: 4 mg/dL — ABNORMAL LOW (ref 6–20)
Calcium, Ion: 1.28 mmol/L (ref 1.15–1.40)
Chloride: 103 mmol/L (ref 98–111)
Creatinine, Ser: 0.7 mg/dL (ref 0.61–1.24)
Glucose, Bld: 119 mg/dL — ABNORMAL HIGH (ref 70–99)
HCT: 39 % (ref 39.0–52.0)
Hemoglobin: 13.3 g/dL (ref 13.0–17.0)
Potassium: 4 mmol/L (ref 3.5–5.1)
Sodium: 141 mmol/L (ref 135–145)
TCO2: 26 mmol/L (ref 22–32)

## 2019-12-05 SURGERY — IVC FILTER REMOVAL
Anesthesia: LOCAL

## 2019-12-05 MED ORDER — MIDAZOLAM HCL 2 MG/2ML IJ SOLN
INTRAMUSCULAR | Status: DC | PRN
  Administered 2019-12-05: 1 mg via INTRAVENOUS

## 2019-12-05 MED ORDER — HEPARIN (PORCINE) IN NACL 1000-0.9 UT/500ML-% IV SOLN
INTRAVENOUS | Status: AC
  Filled 2019-12-05: qty 500

## 2019-12-05 MED ORDER — MIDAZOLAM HCL 2 MG/2ML IJ SOLN
INTRAMUSCULAR | Status: AC
  Filled 2019-12-05: qty 2

## 2019-12-05 MED ORDER — IODIXANOL 320 MG/ML IV SOLN
INTRAVENOUS | Status: DC | PRN
  Administered 2019-12-05: 10 mL via INTRAVENOUS

## 2019-12-05 MED ORDER — FENTANYL CITRATE (PF) 100 MCG/2ML IJ SOLN
INTRAMUSCULAR | Status: DC | PRN
  Administered 2019-12-05: 25 ug via INTRAVENOUS

## 2019-12-05 MED ORDER — SODIUM CHLORIDE 0.9 % IV SOLN: INTRAVENOUS | Status: DC

## 2019-12-05 MED ORDER — LIDOCAINE HCL (PF) 1 % IJ SOLN
INTRAMUSCULAR | Status: DC | PRN
  Administered 2019-12-05: 16 mL via INTRADERMAL

## 2019-12-05 MED ORDER — HEPARIN (PORCINE) IN NACL 1000-0.9 UT/500ML-% IV SOLN
INTRAVENOUS | Status: DC | PRN
  Administered 2019-12-05: 500 mL

## 2019-12-05 MED ORDER — LIDOCAINE HCL (PF) 1 % IJ SOLN
INTRAMUSCULAR | Status: AC
  Filled 2019-12-05: qty 30

## 2019-12-05 MED ORDER — FENTANYL CITRATE (PF) 100 MCG/2ML IJ SOLN
INTRAMUSCULAR | Status: AC
  Filled 2019-12-05: qty 2

## 2019-12-05 SURGICAL SUPPLY — 13 items
BAG SNAP BAND KOVER 36X36 (MISCELLANEOUS) ×2 IMPLANT
CATH ANGIO 5F BER2 65CM (CATHETERS) ×2 IMPLANT
COVER DOME SNAP 22 D (MISCELLANEOUS) ×2 IMPLANT
KIT MICROPUNCTURE NIT STIFF (SHEATH) ×2 IMPLANT
KIT PV (KITS) ×2 IMPLANT
PROTECTION STATION PRESSURIZED (MISCELLANEOUS) ×2
SET VENACAVA FILTER RETRIEVAL (MISCELLANEOUS) ×4 IMPLANT
SHEATH PINNACLE 5F 10CM (SHEATH) ×2 IMPLANT
SHEATH PROBE COVER 6X72 (BAG) ×2 IMPLANT
STATION PROTECTION PRESSURIZED (MISCELLANEOUS) ×1 IMPLANT
TRAY PV CATH (CUSTOM PROCEDURE TRAY) ×2 IMPLANT
WIRE BENTSON .035X145CM (WIRE) ×2 IMPLANT
WIRE ROSEN-J .035X180CM (WIRE) ×2 IMPLANT

## 2019-12-05 NOTE — Discharge Instructions (Signed)
Inferior Vena Cava Filter Removal, Care After This sheet gives you information about how to care for yourself after your procedure. Your health care provider may also give you more specific instructions. If you have problems or questions, contact your health care provider. What can I expect after the procedure? After the procedure, it is common to have:  Mild pain and bruising around your incision in your neck or groin.  Fatigue. Follow these instructions at home: Incision care   Follow instructions from your health care provider about how to take care of your incision. Make sure you:  Remove dressing in 24 hours.  Check your incision area every day for signs of infection. Check for: ? Redness, swelling, or more pain. ? Fluid or blood. ? Warmth. ? Pus or a bad smell. General instructions  Take over-the-counter and prescription medicines only as told by your health care provider.  Do not take baths, swim, or use a hot tub for 5 days. You may take showers after the first 24 hours.  Do not drive for 24 hours if you were given a medicine to help you relax (sedative) during your procedure.  Return to your normal activities as told by your health care provider. Ask your health care provider what activities are safe for you.  Keep all follow-up visits as told by your health care provider. This is important. Contact a health care provider if:  You have chills or a fever.  You have redness, swelling, or more pain around your incision.  Your incision feels warm to the touch.  You have pus or a bad smell coming from your incision. Get help right away if:  You have blood coming from your incision (active bleeding). ? If you have bleeding from the incision site, lie down, apply pressure to the area with a clean cloth or gauze, and get help right away.  You have chest pain.  You have difficulty breathing. Summary  Follow instructions from your health care provider about how to  take care of your incision.  Return to your normal activities as told by your health care provider.  Check your incision area every day for signs of infection.  Get help right away if you have active bleeding, chest pain, or trouble breathing.  This information is not intended to replace advice given to you by your health care provider. Make sure you discuss any questions you have with your health care provider. Document Revised: 12/02/2016 Document Reviewed: 06/30/2016 Elsevier Patient Education  2020 [employer].

## 2019-12-05 NOTE — Progress Notes (Signed)
All information obtained through [place] interpreter via [place].

## 2019-12-05 NOTE — Op Note (Signed)
    Patient name: David Gordon MRN: [id number] DOB: Mar 11, 1985 Sex: male  12/05/2019 Pre-operative Diagnosis: IVC filter Post-operative diagnosis:  Same Surgeon:  Marty Heck, MD Procedure Performed: 1.  Ultrasound-guided access right internal jugular vein 2.  Inferior venacavogram 3.  Removal of IVC filter 4.  23 minutes of monitored moderate conscious sedation time  Indications: Patient is a 34 year old male who had an IVC filter placed by Dr. Donnetta Hutching in October 2021.  He presents today for IVC filter removal after recently being seen in the office after risk benefits discussed.  Findings:   Ultrasound-guided access of right internal jugular vein.  Inferior venacavogram showed no thrombus in the filter.  The IVC filter was successfully retrieved with a snare and filter retrieval kit intact with no immediate complications.   Procedure:  The patient was identified in the holding area and taken to room 8.  The patient was then placed supine on the table and prepped and draped in the usual sterile fashion.  A time out was called.  Ultrasound was used to evaluate the right internal jugular vein.  It was patent and image was saved.  This was accessed with a micro access needle placed a microwire and ultimately micro sheath.  I then upsized to a 5 [place] sheath over a Bentson wire.  I used a BER 2 catheter with a Bentson wire to get into the inferior vena cava.  I did a hand-injection through the BER 2 catheter below the filter to ensure there was no thrombus in the filter and it was patent.  That point in time we used a long Rosen wire and exchanged for the [employer] and long retrieval sheath was placed in the right IJ into the inferior vena cava.  I then used the snare to snare the filter and it was subsequently retrieved and pulled into the sheath in its entirety and removed.  4-0 Monocryl was tied around the sheath and the sheath was removed.  Manual pressure was held.  He  remained stable throughout the case.    Marty Heck, MD [facility] [place] Office: [phone]
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
## 2021-12-22 ENCOUNTER — Emergency Department
Admission: EM | Admit: 2021-12-22 | Discharge: 2021-12-22 | Discharge status: Absent without leave | Payer: Medicaid Other
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
## 2022-04-09 IMAGING — DX DG CHEST 1V PORT
1 series · 1 of 1 positions shown · non-contrast
Comparison: October 14, 2019 chest radiograph and chest CT

CLINICAL DATA: Shortness of breath.  [id number] positive

EXAM:
PORTABLE CHEST 1 VIEW

[chest]
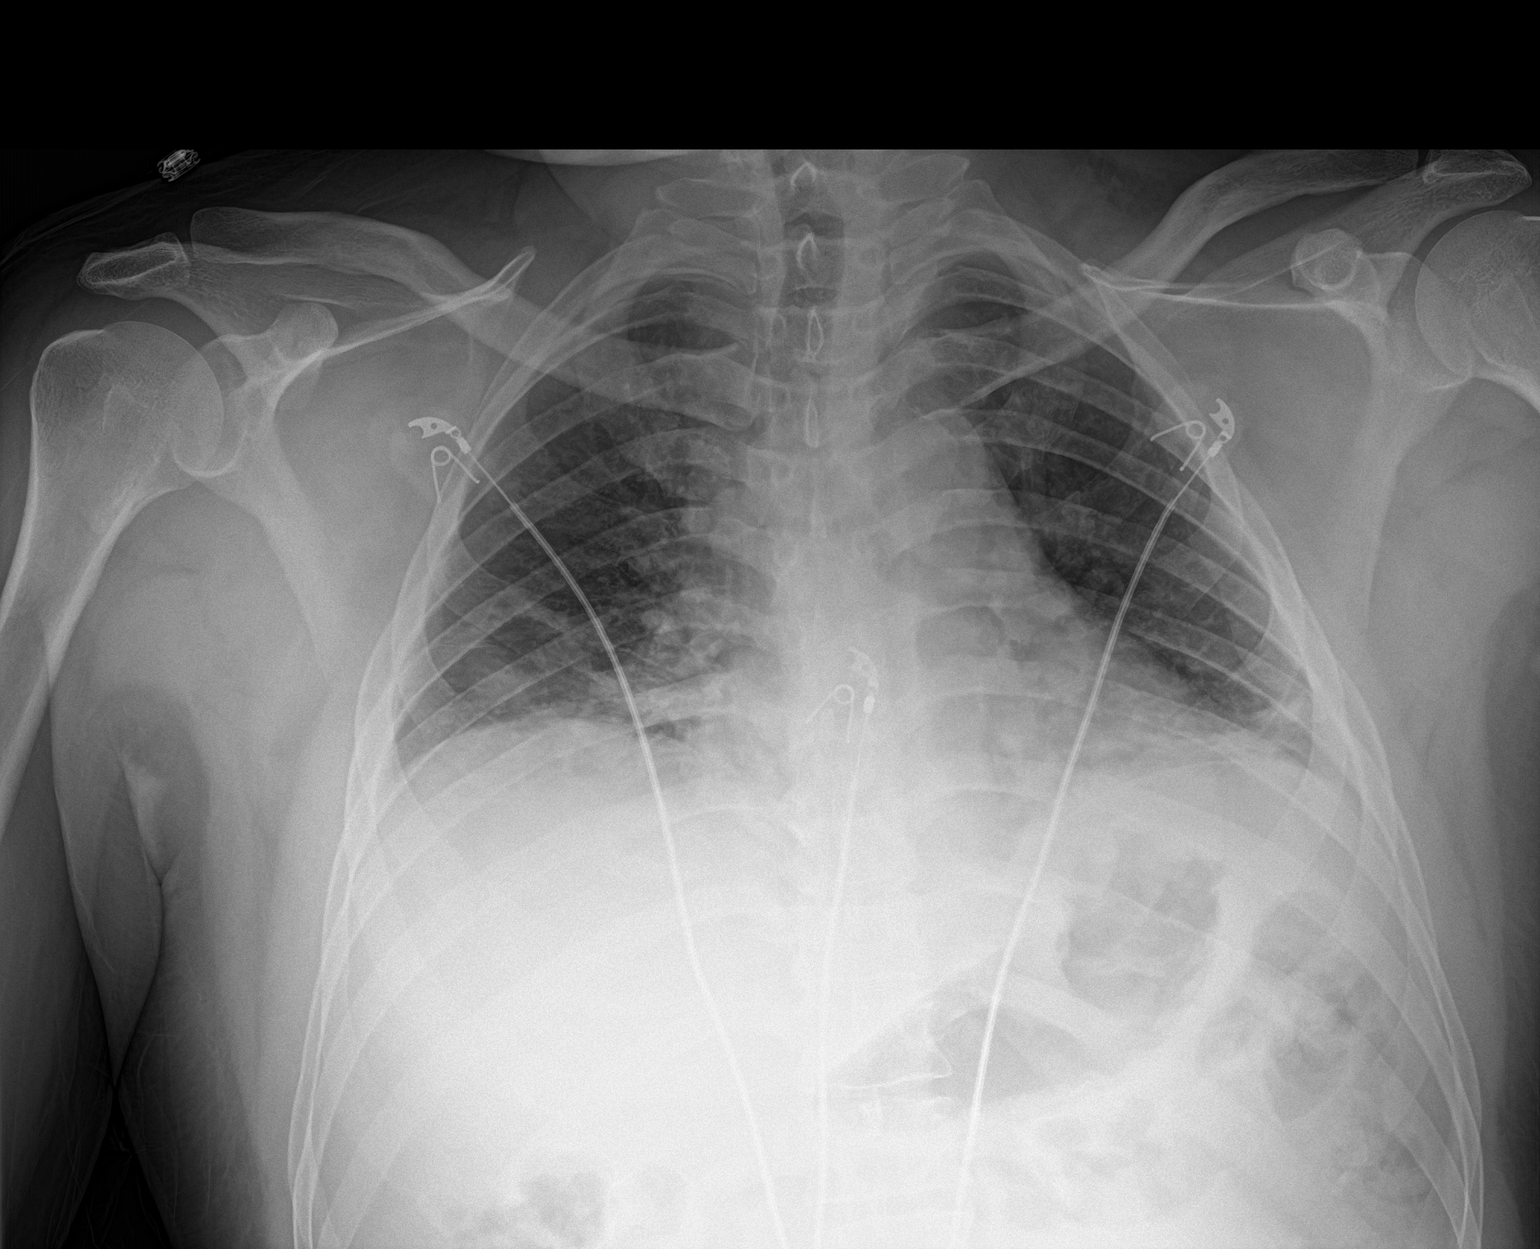

[1 of 1 positions shown; findings below may reference images not displayed]

FINDINGS: There is ill-defined opacity in the lung bases. Lungs elsewhere
clear. Heart is upper normal in size with pulmonary vascularity
normal. No adenopathy. No bone lesions.
IMPRESSION: Ill-defined airspace opacity in the lung bases consistent with
combination of atelectasis and suspected atypical organism
pneumonia. Lungs elsewhere clear. Heart upper normal in size. No
adenopathy evident by radiography.
